# Patient Record
Sex: Female | Born: 1940 | Race: White | Hispanic: No | Marital: Married | Smoking: Never smoker
Health system: Southern US, Community
[De-identification: ages and names within clinical notes are randomized; demographics above are authoritative.]

## PROBLEM LIST (undated history)

## (undated) DIAGNOSIS — F32A Depression, unspecified: Secondary | ICD-10-CM

## (undated) DIAGNOSIS — F419 Anxiety disorder, unspecified: Secondary | ICD-10-CM

## (undated) DIAGNOSIS — F0390 Unspecified dementia without behavioral disturbance: Principal | ICD-10-CM

## (undated) DIAGNOSIS — N393 Stress incontinence (female) (male): Secondary | ICD-10-CM

## (undated) DIAGNOSIS — F329 Major depressive disorder, single episode, unspecified: Secondary | ICD-10-CM

## (undated) DIAGNOSIS — Z86011 Personal history of benign neoplasm of the brain: Secondary | ICD-10-CM

## (undated) HISTORY — DX: Unspecified dementia without behavioral disturbance: F03.90

## (undated) HISTORY — DX: Personal history of benign neoplasm of the brain: Z86.011

## (undated) HISTORY — DX: Stress incontinence (female) (male): N39.3

## (undated) HISTORY — DX: Major depressive disorder, single episode, unspecified: F32.9

## (undated) HISTORY — DX: Anxiety disorder, unspecified: F41.9

## (undated) HISTORY — DX: Depression, unspecified: F32.A

---
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
## 1973-08-11 HISTORY — PX: TUBAL LIGATION: SHX77

## 1998-02-28 ENCOUNTER — Other Ambulatory Visit: Admission: RE | Admit: 1998-02-28 | Discharge: 1998-02-28 | Payer: Self-pay | Admitting: *Deleted

## 1998-10-04 ENCOUNTER — Encounter: Payer: Self-pay | Admitting: *Deleted

## 1998-10-04 ENCOUNTER — Ambulatory Visit (HOSPITAL_COMMUNITY): Admission: RE | Admit: 1998-10-04 | Discharge: 1998-10-04 | Payer: Self-pay | Admitting: Obstetrics and Gynecology

## 1999-04-26 ENCOUNTER — Ambulatory Visit (HOSPITAL_COMMUNITY): Admission: RE | Admit: 1999-04-26 | Discharge: 1999-04-26 | Payer: Self-pay | Admitting: Gastroenterology

## 1999-05-08 ENCOUNTER — Other Ambulatory Visit: Admission: RE | Admit: 1999-05-08 | Discharge: 1999-05-08 | Payer: Self-pay | Admitting: *Deleted

## 1999-10-08 ENCOUNTER — Encounter: Payer: Self-pay | Admitting: *Deleted

## 1999-10-08 ENCOUNTER — Ambulatory Visit (HOSPITAL_COMMUNITY): Admission: RE | Admit: 1999-10-08 | Discharge: 1999-10-08 | Payer: Self-pay | Admitting: *Deleted

## 2000-05-08 ENCOUNTER — Other Ambulatory Visit: Admission: RE | Admit: 2000-05-08 | Discharge: 2000-05-08 | Payer: Self-pay | Admitting: *Deleted

## 2000-10-08 ENCOUNTER — Encounter: Payer: Self-pay | Admitting: *Deleted

## 2000-10-08 ENCOUNTER — Ambulatory Visit (HOSPITAL_COMMUNITY): Admission: RE | Admit: 2000-10-08 | Discharge: 2000-10-08 | Payer: Self-pay | Admitting: *Deleted

## 2001-05-12 ENCOUNTER — Other Ambulatory Visit: Admission: RE | Admit: 2001-05-12 | Discharge: 2001-05-12 | Payer: Self-pay | Admitting: *Deleted

## 2001-10-13 ENCOUNTER — Encounter: Payer: Self-pay | Admitting: *Deleted

## 2001-10-13 ENCOUNTER — Ambulatory Visit (HOSPITAL_COMMUNITY): Admission: RE | Admit: 2001-10-13 | Discharge: 2001-10-13 | Payer: Self-pay | Admitting: *Deleted

## 2002-05-16 ENCOUNTER — Other Ambulatory Visit: Admission: RE | Admit: 2002-05-16 | Discharge: 2002-05-16 | Payer: Self-pay | Admitting: *Deleted

## 2002-10-17 ENCOUNTER — Encounter: Payer: Self-pay | Admitting: *Deleted

## 2002-10-17 ENCOUNTER — Ambulatory Visit (HOSPITAL_COMMUNITY): Admission: RE | Admit: 2002-10-17 | Discharge: 2002-10-17 | Payer: Self-pay | Admitting: *Deleted

## 2003-10-18 ENCOUNTER — Ambulatory Visit (HOSPITAL_COMMUNITY): Admission: RE | Admit: 2003-10-18 | Discharge: 2003-10-18 | Payer: Self-pay | Admitting: *Deleted

## 2004-05-23 ENCOUNTER — Other Ambulatory Visit: Admission: RE | Admit: 2004-05-23 | Discharge: 2004-05-23 | Payer: Self-pay | Admitting: *Deleted

## 2004-06-20 ENCOUNTER — Ambulatory Visit (HOSPITAL_COMMUNITY): Admission: RE | Admit: 2004-06-20 | Discharge: 2004-06-20 | Payer: Self-pay | Admitting: Gastroenterology

## 2004-10-21 ENCOUNTER — Ambulatory Visit (HOSPITAL_COMMUNITY): Admission: RE | Admit: 2004-10-21 | Discharge: 2004-10-21 | Payer: Self-pay | Admitting: *Deleted

## 2005-05-26 ENCOUNTER — Other Ambulatory Visit: Admission: RE | Admit: 2005-05-26 | Discharge: 2005-05-26 | Payer: Self-pay | Admitting: Obstetrics and Gynecology

## 2005-06-03 ENCOUNTER — Encounter: Admission: RE | Admit: 2005-06-03 | Discharge: 2005-06-03 | Payer: Self-pay | Admitting: Obstetrics and Gynecology

## 2005-10-22 ENCOUNTER — Encounter: Admission: RE | Admit: 2005-10-22 | Discharge: 2005-10-22 | Payer: Self-pay | Admitting: Obstetrics and Gynecology

## 2006-11-11 ENCOUNTER — Ambulatory Visit (HOSPITAL_COMMUNITY): Admission: RE | Admit: 2006-11-11 | Discharge: 2006-11-11 | Payer: Self-pay | Admitting: Internal Medicine

## 2006-11-24 ENCOUNTER — Encounter: Admission: RE | Admit: 2006-11-24 | Discharge: 2006-11-24 | Payer: Self-pay | Admitting: Internal Medicine

## 2007-06-24 ENCOUNTER — Other Ambulatory Visit: Admission: RE | Admit: 2007-06-24 | Discharge: 2007-06-24 | Payer: Self-pay | Admitting: Obstetrics & Gynecology

## 2007-11-26 ENCOUNTER — Ambulatory Visit (HOSPITAL_COMMUNITY): Admission: RE | Admit: 2007-11-26 | Discharge: 2007-11-26 | Payer: Self-pay | Admitting: Internal Medicine

## 2008-07-17 ENCOUNTER — Encounter: Admission: RE | Admit: 2008-07-17 | Discharge: 2008-07-17 | Payer: Self-pay | Admitting: Internal Medicine

## 2008-11-27 ENCOUNTER — Ambulatory Visit (HOSPITAL_COMMUNITY): Admission: RE | Admit: 2008-11-27 | Discharge: 2008-11-27 | Payer: Self-pay | Admitting: Obstetrics & Gynecology

## 2009-09-18 ENCOUNTER — Encounter: Admission: RE | Admit: 2009-09-18 | Discharge: 2009-09-18 | Payer: Self-pay | Admitting: Internal Medicine

## 2009-11-18 ENCOUNTER — Observation Stay (HOSPITAL_COMMUNITY): Admission: EM | Admit: 2009-11-18 | Discharge: 2009-11-19 | Payer: Self-pay | Admitting: Emergency Medicine

## 2009-12-17 ENCOUNTER — Ambulatory Visit (HOSPITAL_COMMUNITY): Admission: RE | Admit: 2009-12-17 | Discharge: 2009-12-17 | Payer: Self-pay | Admitting: Internal Medicine

## 2009-12-31 ENCOUNTER — Ambulatory Visit: Payer: Self-pay | Admitting: Psychology

## 2010-03-02 ENCOUNTER — Emergency Department (HOSPITAL_COMMUNITY): Admission: EM | Admit: 2010-03-02 | Discharge: 2010-03-02 | Payer: Self-pay | Admitting: Emergency Medicine

## 2010-03-10 ENCOUNTER — Emergency Department (HOSPITAL_COMMUNITY): Admission: EM | Admit: 2010-03-10 | Discharge: 2010-03-10 | Payer: Self-pay | Admitting: Emergency Medicine

## 2010-09-01 ENCOUNTER — Encounter: Payer: Self-pay | Admitting: Internal Medicine

## 2010-10-26 LAB — URINALYSIS, ROUTINE W REFLEX MICROSCOPIC
Bilirubin Urine: NEGATIVE
Ketones, ur: NEGATIVE mg/dL
Nitrite: NEGATIVE
Nitrite: NEGATIVE
Protein, ur: NEGATIVE mg/dL
Urobilinogen, UA: 0.2 mg/dL (ref 0.0–1.0)
Urobilinogen, UA: 0.2 mg/dL (ref 0.0–1.0)

## 2010-10-26 LAB — COMPREHENSIVE METABOLIC PANEL
Albumin: 3.5 g/dL (ref 3.5–5.2)
BUN: 10 mg/dL (ref 6–23)
CO2: 26 mEq/L (ref 19–32)
Calcium: 8.9 mg/dL (ref 8.4–10.5)
Chloride: 104 mEq/L (ref 96–112)
Creatinine, Ser: 0.85 mg/dL (ref 0.4–1.2)
Total Bilirubin: 1.3 mg/dL — ABNORMAL HIGH (ref 0.3–1.2)
Total Protein: 6.3 g/dL (ref 6.0–8.3)

## 2010-10-26 LAB — CBC
HCT: 35.9 % — ABNORMAL LOW (ref 36.0–46.0)
HCT: 40.9 % (ref 36.0–46.0)
Hemoglobin: 14.3 g/dL (ref 12.0–15.0)
MCH: 33.5 pg (ref 26.0–34.0)
MCHC: 34.5 g/dL (ref 30.0–36.0)
MCHC: 34.9 g/dL (ref 30.0–36.0)
MCV: 96.1 fL (ref 78.0–100.0)
Platelets: 205 10*3/uL (ref 150–400)
Platelets: 284 10*3/uL (ref 150–400)
RDW: 13 % (ref 11.5–15.5)
RDW: 13.1 % (ref 11.5–15.5)
WBC: 5.6 10*3/uL (ref 4.0–10.5)

## 2010-10-26 LAB — HEPATIC FUNCTION PANEL
ALT: 26 U/L (ref 0–35)
AST: 23 U/L (ref 0–37)
Bilirubin, Direct: 0.2 mg/dL (ref 0.0–0.3)
Total Bilirubin: 0.9 mg/dL (ref 0.3–1.2)

## 2010-10-26 LAB — URINE CULTURE: Culture  Setup Time: 201107231803

## 2010-10-26 LAB — POCT I-STAT, CHEM 8
BUN: 12 mg/dL (ref 6–23)
Calcium, Ion: 1.07 mmol/L — ABNORMAL LOW (ref 1.12–1.32)
Creatinine, Ser: 0.7 mg/dL (ref 0.4–1.2)
TCO2: 24 mmol/L (ref 0–100)

## 2010-10-26 LAB — DIFFERENTIAL
Basophils Absolute: 0 10*3/uL (ref 0.0–0.1)
Basophils Absolute: 0 10*3/uL (ref 0.0–0.1)
Eosinophils Absolute: 0.1 10*3/uL (ref 0.0–0.7)
Lymphs Abs: 1.2 10*3/uL (ref 0.7–4.0)
Neutro Abs: 5.6 10*3/uL (ref 1.7–7.7)
Neutrophils Relative %: 74 % (ref 43–77)

## 2010-10-26 LAB — POCT CARDIAC MARKERS: CKMB, poc: <1 ng/mL — ABNORMAL LOW (ref 1.0–8.0)

## 2010-10-26 LAB — LIPASE, BLOOD: Lipase: 35 U/L (ref 11–59)

## 2010-10-30 LAB — CBC
HCT: 37.1 % (ref 36.0–46.0)
HCT: 40.2 % (ref 36.0–46.0)
MCV: 97.5 fL (ref 78.0–100.0)
Platelets: 166 10*3/uL (ref 150–400)
Platelets: 180 10*3/uL (ref 150–400)
RBC: 4.12 MIL/uL (ref 3.87–5.11)
RDW: 12.9 % (ref 11.5–15.5)
WBC: 5.1 10*3/uL (ref 4.0–10.5)
WBC: 5.7 10*3/uL (ref 4.0–10.5)

## 2010-10-30 LAB — COMPREHENSIVE METABOLIC PANEL
ALT: 14 U/L (ref 0–35)
AST: 20 U/L (ref 0–37)
AST: 21 U/L (ref 0–37)
Albumin: 3.5 g/dL (ref 3.5–5.2)
Albumin: 3.8 g/dL (ref 3.5–5.2)
Alkaline Phosphatase: 51 U/L (ref 39–117)
Alkaline Phosphatase: 55 U/L (ref 39–117)
BUN: 9 mg/dL (ref 6–23)
CO2: 18 mEq/L — ABNORMAL LOW (ref 19–32)
Chloride: 108 mEq/L (ref 96–112)
Chloride: 109 mEq/L (ref 96–112)
Creatinine, Ser: 0.8 mg/dL (ref 0.4–1.2)
GFR calc Af Amer: >60 mL/min (ref >60)
GFR calc Af Amer: >60 mL/min (ref >60)
GFR calc non Af Amer: >60 mL/min (ref >60)
Potassium: 3.5 mEq/L (ref 3.5–5.1)
Potassium: 3.7 mEq/L (ref 3.5–5.1)
Sodium: 139 mEq/L (ref 135–145)
Total Bilirubin: 1.6 mg/dL — ABNORMAL HIGH (ref 0.3–1.2)
Total Bilirubin: 2.2 mg/dL — ABNORMAL HIGH (ref 0.3–1.2)
Total Protein: 6 g/dL (ref 6.0–8.3)

## 2010-10-30 LAB — URINALYSIS, ROUTINE W REFLEX MICROSCOPIC
Glucose, UA: NEGATIVE mg/dL
Nitrite: NEGATIVE
pH: 7 (ref 5.0–8.0)

## 2010-10-30 LAB — POCT I-STAT, CHEM 8
BUN: 11 mg/dL (ref 6–23)
Chloride: 105 mEq/L (ref 96–112)
HCT: 42 % (ref 36.0–46.0)
Sodium: 135 mEq/L (ref 135–145)
TCO2: 18 mmol/L (ref 0–100)

## 2010-10-30 LAB — DIFFERENTIAL
Basophils Absolute: 0 10*3/uL (ref 0.0–0.1)
Basophils Relative: 1 % (ref 0–1)
Eosinophils Absolute: 0 10*3/uL (ref 0.0–0.7)
Eosinophils Relative: 1 % (ref 0–5)
Lymphocytes Relative: 20 % (ref 12–46)
Monocytes Absolute: 0.7 10*3/uL (ref 0.1–1.0)

## 2010-12-20 ENCOUNTER — Other Ambulatory Visit (HOSPITAL_COMMUNITY): Payer: Self-pay | Admitting: Internal Medicine

## 2010-12-20 DIAGNOSIS — Z1231 Encounter for screening mammogram for malignant neoplasm of breast: Secondary | ICD-10-CM

## 2010-12-27 ENCOUNTER — Ambulatory Visit (HOSPITAL_COMMUNITY)
Admission: RE | Admit: 2010-12-27 | Discharge: 2010-12-27 | Disposition: A | Discharge status: Home / Self Care | Payer: Medicare Other | Source: Ambulatory Visit | Attending: Internal Medicine | Admitting: Internal Medicine

## 2010-12-27 DIAGNOSIS — Z1231 Encounter for screening mammogram for malignant neoplasm of breast: Secondary | ICD-10-CM

## 2010-12-27 NOTE — Op Note (Signed)
NAMESELYNA, KLAHN                 ACCOUNT NO.:  [id number]   MEDICAL RECORD NO.:  [id number]          PATIENT TYPE:  AMB   LOCATION:  ENDO                         FACILITY:  [facility]   PHYSICIAN:  James L. Malon Kindle., M.D.DATE OF BIRTH:  [birth year]   DATE OF PROCEDURE:  06/20/2004  DATE OF DISCHARGE:                                 OPERATIVE REPORT   PROCEDURE:  Colonoscopy.   MEDICATIONS:  Fentanyl 75 mcg, Versed 10 mg IV.   SCOPE:  Olympus pediatric adjustable colonoscope.   INDICATIONS:  Strong family history of colon cancer done as a 5-year  followup procedure.   DESCRIPTION OF PROCEDURE:  Procedure had been explained to the patient and  consent obtained. With the patient in left lateral decubitus position,  Olympus scope was inserted and advanced. Prep was excellent. Using slight  abdominal pressure, we were able to reach the cecum without difficulty. The  ileocecal valve and appendiceal orifice were seen. The scope was withdrawn  to the cecum. Ascending colon, transverse colon, splenic flexure,  descending, and sigmoid colon were seen well upon removal. No polyps or  lesions were seen. The scope was withdrawn, and the patient tolerated the  procedure well and was maintained on low-flow oxygen and pulse oximetry  throughout.   ASSESSMENT:  Normal colonoscopy in a woman with a strong family history of  colon cancer, V16.0.   PLAN:  Will recommend yearly Hemoccults and repeat colonoscopy in 5 years.      JLE/MEDQ  D:  06/20/2004  T:  06/20/2004  Job:  [id number]

## 2011-09-17 DIAGNOSIS — C44519 Basal cell carcinoma of skin of other part of trunk: Secondary | ICD-10-CM | POA: Diagnosis not present

## 2011-09-17 DIAGNOSIS — Z85828 Personal history of other malignant neoplasm of skin: Secondary | ICD-10-CM | POA: Diagnosis not present

## 2011-09-17 DIAGNOSIS — D239 Other benign neoplasm of skin, unspecified: Secondary | ICD-10-CM | POA: Diagnosis not present

## 2011-09-17 DIAGNOSIS — L821 Other seborrheic keratosis: Secondary | ICD-10-CM | POA: Diagnosis not present

## 2011-09-23 DIAGNOSIS — D485 Neoplasm of uncertain behavior of skin: Secondary | ICD-10-CM | POA: Diagnosis not present

## 2011-09-23 DIAGNOSIS — L851 Acquired keratosis [keratoderma] palmaris et plantaris: Secondary | ICD-10-CM | POA: Diagnosis not present

## 2011-09-23 DIAGNOSIS — C44519 Basal cell carcinoma of skin of other part of trunk: Secondary | ICD-10-CM | POA: Diagnosis not present

## 2011-11-11 DIAGNOSIS — J343 Hypertrophy of nasal turbinates: Secondary | ICD-10-CM | POA: Diagnosis not present

## 2011-11-11 DIAGNOSIS — J342 Deviated nasal septum: Secondary | ICD-10-CM | POA: Diagnosis not present

## 2011-11-27 DIAGNOSIS — E785 Hyperlipidemia, unspecified: Secondary | ICD-10-CM | POA: Diagnosis not present

## 2011-11-27 DIAGNOSIS — M899 Disorder of bone, unspecified: Secondary | ICD-10-CM | POA: Diagnosis not present

## 2011-11-27 DIAGNOSIS — E042 Nontoxic multinodular goiter: Secondary | ICD-10-CM | POA: Diagnosis not present

## 2011-11-27 DIAGNOSIS — Z0000 Encounter for general adult medical examination without abnormal findings: Secondary | ICD-10-CM | POA: Diagnosis not present

## 2011-11-27 DIAGNOSIS — R82998 Other abnormal findings in urine: Secondary | ICD-10-CM | POA: Diagnosis not present

## 2011-12-02 DIAGNOSIS — Z01419 Encounter for gynecological examination (general) (routine) without abnormal findings: Secondary | ICD-10-CM | POA: Diagnosis not present

## 2011-12-02 DIAGNOSIS — Z124 Encounter for screening for malignant neoplasm of cervix: Secondary | ICD-10-CM | POA: Diagnosis not present

## 2011-12-04 ENCOUNTER — Other Ambulatory Visit: Payer: Self-pay | Admitting: Internal Medicine

## 2011-12-04 DIAGNOSIS — E042 Nontoxic multinodular goiter: Secondary | ICD-10-CM | POA: Diagnosis not present

## 2011-12-04 DIAGNOSIS — E785 Hyperlipidemia, unspecified: Secondary | ICD-10-CM | POA: Diagnosis not present

## 2011-12-04 DIAGNOSIS — E041 Nontoxic single thyroid nodule: Secondary | ICD-10-CM

## 2011-12-04 DIAGNOSIS — F341 Dysthymic disorder: Secondary | ICD-10-CM | POA: Diagnosis not present

## 2011-12-04 DIAGNOSIS — Z0000 Encounter for general adult medical examination without abnormal findings: Secondary | ICD-10-CM | POA: Diagnosis not present

## 2011-12-05 ENCOUNTER — Ambulatory Visit
Admission: RE | Admit: 2011-12-05 | Discharge: 2011-12-05 | Disposition: A | Discharge status: Home / Self Care | Payer: Medicare Other | Source: Ambulatory Visit | Attending: Internal Medicine | Admitting: Internal Medicine

## 2011-12-05 DIAGNOSIS — E042 Nontoxic multinodular goiter: Secondary | ICD-10-CM | POA: Diagnosis not present

## 2011-12-05 DIAGNOSIS — E041 Nontoxic single thyroid nodule: Secondary | ICD-10-CM

## 2011-12-09 DIAGNOSIS — Z1212 Encounter for screening for malignant neoplasm of rectum: Secondary | ICD-10-CM | POA: Diagnosis not present

## 2011-12-16 ENCOUNTER — Other Ambulatory Visit: Payer: Self-pay | Admitting: Obstetrics & Gynecology

## 2011-12-16 DIAGNOSIS — Z1231 Encounter for screening mammogram for malignant neoplasm of breast: Secondary | ICD-10-CM

## 2012-01-09 ENCOUNTER — Ambulatory Visit (HOSPITAL_COMMUNITY)
Admission: RE | Admit: 2012-01-09 | Discharge: 2012-01-09 | Disposition: A | Discharge status: Home / Self Care | Payer: Medicare Other | Source: Ambulatory Visit | Attending: Obstetrics & Gynecology | Admitting: Obstetrics & Gynecology

## 2012-01-09 DIAGNOSIS — Z1231 Encounter for screening mammogram for malignant neoplasm of breast: Secondary | ICD-10-CM | POA: Insufficient documentation

## 2012-01-28 DIAGNOSIS — L919 Hypertrophic disorder of the skin, unspecified: Secondary | ICD-10-CM | POA: Diagnosis not present

## 2012-01-28 DIAGNOSIS — L821 Other seborrheic keratosis: Secondary | ICD-10-CM | POA: Diagnosis not present

## 2012-01-28 DIAGNOSIS — L909 Atrophic disorder of skin, unspecified: Secondary | ICD-10-CM | POA: Diagnosis not present

## 2012-02-04 DIAGNOSIS — M87059 Idiopathic aseptic necrosis of unspecified femur: Secondary | ICD-10-CM | POA: Diagnosis not present

## 2012-03-29 DIAGNOSIS — Z1211 Encounter for screening for malignant neoplasm of colon: Secondary | ICD-10-CM | POA: Diagnosis not present

## 2012-03-29 DIAGNOSIS — Z800 Family history of malignant neoplasm of digestive organs: Secondary | ICD-10-CM | POA: Diagnosis not present

## 2012-03-29 DIAGNOSIS — K648 Other hemorrhoids: Secondary | ICD-10-CM | POA: Diagnosis not present

## 2012-04-16 DIAGNOSIS — H2510 Age-related nuclear cataract, unspecified eye: Secondary | ICD-10-CM | POA: Diagnosis not present

## 2012-04-16 DIAGNOSIS — H52209 Unspecified astigmatism, unspecified eye: Secondary | ICD-10-CM | POA: Diagnosis not present

## 2012-04-16 DIAGNOSIS — H5200 Hypermetropia, unspecified eye: Secondary | ICD-10-CM | POA: Diagnosis not present

## 2012-04-16 DIAGNOSIS — L0293 Carbuncle, unspecified: Secondary | ICD-10-CM | POA: Diagnosis not present

## 2012-04-16 DIAGNOSIS — L0292 Furuncle, unspecified: Secondary | ICD-10-CM | POA: Diagnosis not present

## 2012-05-04 DIAGNOSIS — Z23 Encounter for immunization: Secondary | ICD-10-CM | POA: Diagnosis not present

## 2012-07-16 DIAGNOSIS — F341 Dysthymic disorder: Secondary | ICD-10-CM | POA: Diagnosis not present

## 2012-07-16 DIAGNOSIS — M1990 Unspecified osteoarthritis, unspecified site: Secondary | ICD-10-CM | POA: Diagnosis not present

## 2012-07-16 DIAGNOSIS — E042 Nontoxic multinodular goiter: Secondary | ICD-10-CM | POA: Diagnosis not present

## 2012-07-16 DIAGNOSIS — R5381 Other malaise: Secondary | ICD-10-CM | POA: Diagnosis not present

## 2012-07-16 DIAGNOSIS — R413 Other amnesia: Secondary | ICD-10-CM | POA: Diagnosis not present

## 2012-07-16 DIAGNOSIS — R5383 Other fatigue: Secondary | ICD-10-CM | POA: Diagnosis not present

## 2012-09-01 DIAGNOSIS — F0390 Unspecified dementia without behavioral disturbance: Secondary | ICD-10-CM | POA: Diagnosis not present

## 2012-09-01 DIAGNOSIS — G501 Atypical facial pain: Secondary | ICD-10-CM | POA: Diagnosis not present

## 2012-09-01 DIAGNOSIS — F22 Delusional disorders: Secondary | ICD-10-CM | POA: Diagnosis not present

## 2012-10-14 DIAGNOSIS — L819 Disorder of pigmentation, unspecified: Secondary | ICD-10-CM | POA: Diagnosis not present

## 2012-10-14 DIAGNOSIS — D485 Neoplasm of uncertain behavior of skin: Secondary | ICD-10-CM | POA: Diagnosis not present

## 2012-10-14 DIAGNOSIS — L820 Inflamed seborrheic keratosis: Secondary | ICD-10-CM | POA: Diagnosis not present

## 2012-10-14 DIAGNOSIS — L723 Sebaceous cyst: Secondary | ICD-10-CM | POA: Diagnosis not present

## 2012-10-14 DIAGNOSIS — D1801 Hemangioma of skin and subcutaneous tissue: Secondary | ICD-10-CM | POA: Diagnosis not present

## 2012-10-14 DIAGNOSIS — L821 Other seborrheic keratosis: Secondary | ICD-10-CM | POA: Diagnosis not present

## 2012-10-14 DIAGNOSIS — D239 Other benign neoplasm of skin, unspecified: Secondary | ICD-10-CM | POA: Diagnosis not present

## 2012-12-02 ENCOUNTER — Ambulatory Visit (INDEPENDENT_AMBULATORY_CARE_PROVIDER_SITE_OTHER): Payer: Medicare Other | Admitting: Neurology

## 2012-12-02 ENCOUNTER — Encounter: Payer: Self-pay | Admitting: Neurology

## 2012-12-02 VITALS — BP 145/87 | HR 65 | Temp 97.6°F | Ht 64.0 in | Wt 165.5 lb

## 2012-12-02 DIAGNOSIS — F419 Anxiety disorder, unspecified: Secondary | ICD-10-CM

## 2012-12-02 DIAGNOSIS — F411 Generalized anxiety disorder: Secondary | ICD-10-CM | POA: Diagnosis not present

## 2012-12-02 DIAGNOSIS — Z86011 Personal history of benign neoplasm of the brain: Secondary | ICD-10-CM

## 2012-12-02 DIAGNOSIS — F32A Depression, unspecified: Secondary | ICD-10-CM

## 2012-12-02 DIAGNOSIS — F329 Major depressive disorder, single episode, unspecified: Secondary | ICD-10-CM | POA: Insufficient documentation

## 2012-12-02 DIAGNOSIS — F0391 Unspecified dementia with behavioral disturbance: Secondary | ICD-10-CM | POA: Insufficient documentation

## 2012-12-02 DIAGNOSIS — F0390 Unspecified dementia without behavioral disturbance: Secondary | ICD-10-CM | POA: Diagnosis not present

## 2012-12-02 HISTORY — DX: Anxiety disorder, unspecified: F41.9

## 2012-12-02 HISTORY — DX: Depression, unspecified: F32.A

## 2012-12-02 HISTORY — DX: Unspecified dementia, unspecified severity, without behavioral disturbance, psychotic disturbance, mood disturbance, and anxiety: F03.90

## 2012-12-02 HISTORY — DX: Personal history of benign neoplasm of the brain: Z86.011

## 2012-12-02 NOTE — Patient Instructions (Addendum)
I think overall you are doing fairly well but I do want to suggest a few things today:  I think you did better today with the memory test!  Remember to drink plenty of fluid, eat healthy meals and do not skip any meals. Try to eat protein with a every meal and eat a healthy snack such as fruit or nuts in between meals. Try to keep a regular sleep-wake schedule and try to exercise daily, particularly in the form of walking, 20-30 minutes a day, if you can.   Engage in social activities in your community and with your family and try to keep up with current events by reading the newspaper or watching the news.   As far as your medications are concerned, I would like to suggest the Exelon Patch. Pls call for refills  I would like to see you back in 3 months, sooner if we need to. Please call us with any interim questions, concerns, problems, updates or refill requests.  Please also call us for any test results so we can go over those with you on the phone. Brett Canales is my clinical assistant and will answer any of your questions and relay your messages to me and also relay most of my messages to you.  Our phone number is [phone]. We also have an after hours call service for urgent matters and there is a physician on-call for urgent questions. For any emergencies you know to call 911 or go to the nearest emergency room.

## 2012-12-02 NOTE — Progress Notes (Signed)
Subjective:    Patient ID: Gina Edwards is a 72 y.o. female.  HPI  Interim history:   Gina Edwards is a very pleasant 72 year old right-handed woman who presents for followup consultation of her memory loss, most likely dementia of the Alzheimer's type. The patient is accompanied by her husband today and today is her first visit with me. She was previously seen by Dr. Carrie Mew was last seen by him on 09/01/2012, at which time he started her on Exelon 4.6 mg patches. He also started her on gabapentin 100 mg 3 times a day for facial pain. Her falls assessment pulls score at the time of 7. She has a history of a meningioma and he suggested reevaluation with an MRI brain at some point. She is currently on Exelon 4.6 mg patch, gabapentin 100 mg 3 times a day as needed for nose/atypical facial pain, Abilify 10 mg daily, Lexapro 10 mg daily, Xanax 1 mg once daily, multivitamin. Her husband states that they never actually started the patch as she is already on other meds and he was concerned about SE. She again is not keen on getting a repeat neuropsych test done, stating: "I don't want to go through this". She has no particular complaint and they both feel her memory has remained about the same in the last 3 months. She has had not recent changes to her medicines and had no recent illness. She did have an episode of vertigo some weeks ago.   I reviewed Dr. Imagene Edwards prior notes and the patient's records and below is a summary of that review:  72 year old right-handed woman with a three-year history of memory loss. She has no history of alcohol or drug abuse or head trauma. 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 Head CT from November 2010 had shown small vessel disease and mild diffuse atrophy. She had blood work in March 2011 including TSH, RPR, B12, all within normal limits. She had neuropsych testing in  May 2011 showing intellectual functioning in the borderline range with full scale IQ of 77. She was placed on generic Aricept but developed side effects including weight loss and hallucinations and eventually the medication was discontinued in August 2011. She is followed by her psychiatrist and a repeat neuropsychological test was recommended by her psychiatrist but she declined. She exercises regularly and denies any diplopia or swallowing problems. She has had right facial pain not consistent with trigeminal neuralgia.  Her Past Medical History Is Significant For: Past Medical History  Diagnosis Date  . Anxiety and depression    Her Past Surgical History Is Significant For: No past surgical history on file.  Her Family History Is Significant For: Family History  Problem Relation Age of Onset  . Heart disease Mother   . Colon cancer Mother   . Prostate cancer Father    Her Social History Is Significant For: History   Social History  . Marital Status: Married    Spouse Name: N/A    Number of Children: N/A  . Years of Education: N/A   Social History Main Topics  . Smoking status: Never Smoker   . Smokeless tobacco: Not on file  . Alcohol Use: No  . Drug Use: No  . Sexually Active: Not on file   Other Topics Concern  . Not on file   Social History Narrative   Pt live at home with spouse.  Caffeine Use- 24oz   Her Allergies Are:  Allergies  Allergen Reactions  . Morphine And Related   :  Her Current Medications Are:  Outpatient Encounter Prescriptions as of 12/02/2012  Medication Sig Dispense Refill  . ABILIFY 2 MG tablet Take 1 tablet by mouth daily.      Marland Kitchen ALPRAZolam (XANAX) 1 MG tablet Take 1 tablet by mouth daily.      Marland Kitchen escitalopram (LEXAPRO) 10 MG tablet Take 2 tablets by mouth daily.      Marland Kitchen gabapentin (NEURONTIN) 100 MG capsule Take 1 capsule by mouth as needed.      Marland Kitchen ibuprofen (ADVIL,MOTRIN) 800 MG tablet Take 2 tablets by mouth daily. 400mg        No  facility-administered encounter medications on file as of 12/02/2012.  : Review of Systems  Respiratory:       Snoring  Gastrointestinal:       Incontinence  Neurological: Positive for weakness.       Memory loss  Psychiatric/Behavioral: Positive for dysphoric mood. The patient is nervous/anxious.     Objective:  Neurologic Exam  Physical Exam Physical Examination:   Filed Vitals:   12/02/12 1046  BP: 145/87  Pulse: 65  Temp: 97.6 F (36.4 C)    General Examination: The patient is a very pleasant 72 y.o. female in no acute distress. She is calm and cooperative with the exam. She denies Auditory Hallucinations and Visual Hallucinations.   HEENT: Normocephalic, atraumatic, pupils are equal, round and reactive to light and accommodation. Funduscopic exam is normal with sharp disc margins noted. Extraocular tracking shows mild saccadic breakdown without nystagmus noted. Hearing is intact. Tympanic membranes are clear bilaterally. Face is symmetric with no facial masking and normal facial sensation. There is no lip, neck or jaw tremor. Neck is mildly rigid with intact passive ROM. There are no carotid bruits on auscultation. Oropharynx exam reveals mild mouth dryness. No significant airway crowding is noted. Mallampati is class II. Tongue protrudes centrally and palate elevates symmetrically.    Chest: is clear to auscultation without wheezing, rhonchi or crackles noted.  Heart: sounds are regular and normal without murmurs, rubs or gallops noted.   Abdomen: is soft, non-tender and non-distended with normal bowel sounds appreciated on auscultation.  Extremities: There is no pitting edema in the distal lower extremities bilaterally.   Skin: is warm and dry with no trophic changes noted.  Musculoskeletal: exam reveals no obvious joint deformities, tenderness or joint swelling or erythema.  Neurologically:  Mental status: The patient is awake and alert, paying good  attention. She  is unable to to completely provide the history. Her husband provides details. She is oriented to: person, place, situation and month of yearHer memory, attention, language and knowledge are impaired. There is no aphasia, agnosia, apraxia or anomia. There is a mild degree of bradyphrenia. Speech is mildly hypophonic with no dysarthria noted. Mood is congruent and affect is normal.  Her MMSE score is 22/30. CDT is 3/4. AFT (Animal Fluency Test) score is 7.   Cranial nerves are as described above under HEENT exam. In addition, shoulder shrug is normal with equal shoulder height noted.  Motor exam: Normal bulk, and strength for age is noted. Tone is Not rigid with absence of cogwheeling in the extremities. There is overall no significant bradykinesia. There is no drift or rebound. There is no tremor. Romberg is negative. Reflexes are 1+ in the upper extremities and 1+ in the lower extremities. Toes are downgoing bilaterally.  Fine motor skills: Finger taps, hand movements, and rapid alternating patting are Not significantly impaired bilaterally. Foot taps and foot agility are mildly impaired bilaterally.   Cerebellar testing shows no dysmetria or intention tremor. There is no truncal or gait ataxia.   Sensory exam is intact to light touch.  Gait, station and balance: She stands up from the seated position with no difficulty. No veering to one side is noted. No leaning to one side. Posture is Age-appropriate. Stance is narrow-based. She turns in en bloc.Balance is Not significantly impaired.     Assessment and Plan:  Assessment and Plan:  In summary, SHANIGUA GIBB is a very pleasant 72 y.o.-year old female with a history of dementia. She also has a history of depression and anxiety. She had not done well in the past with generic Aricept and developed hallucinations. She was recently seen back by Dr. love and he had suggested a trial of Exelon patch but she never started this. Today her MMSE is a little  better than last time as well as her animal fluency. I didn't suggest the medication again to them and would like to see her back for reevaluation in 3 months. I talked to him about potential side effects with Exelon and the patient and her husband were agreeable to trying this. They did not need another prescriptio at this time since they do have the medicine from her last visit. She follows with Glean Hess, nurse practitioner with Dr. Loralie Champagne office for mood disorder. I've encouraged him to call with any interim questions , concerns, problems or updates and refill requests.

## 2012-12-03 DIAGNOSIS — E042 Nontoxic multinodular goiter: Secondary | ICD-10-CM | POA: Diagnosis not present

## 2012-12-03 DIAGNOSIS — R82998 Other abnormal findings in urine: Secondary | ICD-10-CM | POA: Diagnosis not present

## 2012-12-03 DIAGNOSIS — E785 Hyperlipidemia, unspecified: Secondary | ICD-10-CM | POA: Diagnosis not present

## 2012-12-03 DIAGNOSIS — M899 Disorder of bone, unspecified: Secondary | ICD-10-CM | POA: Diagnosis not present

## 2012-12-07 ENCOUNTER — Other Ambulatory Visit: Payer: Self-pay | Admitting: Obstetrics & Gynecology

## 2012-12-07 DIAGNOSIS — Z1231 Encounter for screening mammogram for malignant neoplasm of breast: Secondary | ICD-10-CM

## 2012-12-10 DIAGNOSIS — D320 Benign neoplasm of cerebral meninges: Secondary | ICD-10-CM | POA: Diagnosis not present

## 2012-12-10 DIAGNOSIS — J3489 Other specified disorders of nose and nasal sinuses: Secondary | ICD-10-CM | POA: Diagnosis not present

## 2012-12-10 DIAGNOSIS — Z0000 Encounter for general adult medical examination without abnormal findings: Secondary | ICD-10-CM | POA: Diagnosis not present

## 2012-12-10 DIAGNOSIS — M1990 Unspecified osteoarthritis, unspecified site: Secondary | ICD-10-CM | POA: Diagnosis not present

## 2012-12-10 DIAGNOSIS — J309 Allergic rhinitis, unspecified: Secondary | ICD-10-CM | POA: Diagnosis not present

## 2012-12-10 DIAGNOSIS — R413 Other amnesia: Secondary | ICD-10-CM | POA: Diagnosis not present

## 2012-12-10 DIAGNOSIS — E042 Nontoxic multinodular goiter: Secondary | ICD-10-CM | POA: Diagnosis not present

## 2012-12-10 DIAGNOSIS — K219 Gastro-esophageal reflux disease without esophagitis: Secondary | ICD-10-CM | POA: Diagnosis not present

## 2012-12-13 DIAGNOSIS — Z1212 Encounter for screening for malignant neoplasm of rectum: Secondary | ICD-10-CM | POA: Diagnosis not present

## 2012-12-22 ENCOUNTER — Telehealth: Payer: Self-pay | Admitting: Neurology

## 2012-12-22 MED ORDER — RIVASTIGMINE 4.6 MG/24HR TD PT24: 1.0000 | MEDICATED_PATCH | Freq: Every day | TRANSDERMAL | Status: DC

## 2012-12-23 ENCOUNTER — Encounter: Payer: Self-pay | Admitting: Obstetrics & Gynecology

## 2012-12-23 NOTE — Telephone Encounter (Signed)
Rx sent, samples dispensed

## 2012-12-24 ENCOUNTER — Ambulatory Visit (INDEPENDENT_AMBULATORY_CARE_PROVIDER_SITE_OTHER): Payer: Medicare Other | Admitting: Obstetrics & Gynecology

## 2012-12-24 ENCOUNTER — Encounter: Payer: Self-pay | Admitting: Obstetrics & Gynecology

## 2012-12-24 VITALS — BP 140/76 | HR 64 | Ht 64.25 in | Wt 165.0 lb

## 2012-12-24 DIAGNOSIS — Z01419 Encounter for gynecological examination (general) (routine) without abnormal findings: Secondary | ICD-10-CM | POA: Diagnosis not present

## 2012-12-24 NOTE — Patient Instructions (Signed)

## 2012-12-24 NOTE — Progress Notes (Signed)
Patient ID: Gina Edwards, female   DOB: 1941/05/09, 72 y.o.   MRN: [id number]  72 y.o. G1P1001 MarriedCaucasianF here for annual exam.  No vaginal bleeding.  Doing okay.  Last visit with Dr. Timothy Lasso was about six months ago.  Blood work was all normal.  Definitely more memory issues this year.  On Alzheimer's medication this year.  No LMP recorded. Patient is postmenopausal.          Sexually active: yes  The current method of family planning is post menopausal status.    Exercising: yes  walking Smoker:  no  Health Maintenance: Pap:  09/25/09 History of abnormal Pap:  no  MMG:  01/2012 Colonoscopy:  03/2012 BMD:   2010, Dr. Timothy Lasso follow TDaP:  11/2011 (with Dr. Timothy Lasso) Screening Labs: PCP, Hb today: PCP, Urine today: PCP   reports that she has never smoked. She has never used smokeless tobacco. She reports that she does not drink alcohol or use illicit drugs.  Past Medical History  Diagnosis Date  . Anxiety and depression   . Dementia 12/02/2012  . Depression 12/02/2012  . Anxiety 12/02/2012  . H/O meningioma of the brain 12/02/2012  . SUI (stress urinary incontinence, female)     Past Surgical History  Procedure Laterality Date  . Appendectomy  1957  . Tubal ligation Bilateral 1975    Current Outpatient Prescriptions  Medication Sig Dispense Refill  . ABILIFY 2 MG tablet Take 1 tablet by mouth daily.      Marland Kitchen ALPRAZolam (XANAX) 1 MG tablet Take 1 tablet by mouth daily.      Marland Kitchen escitalopram (LEXAPRO) 10 MG tablet Take 2 tablets by mouth daily.      Marland Kitchen gabapentin (NEURONTIN) 100 MG capsule Take 1 capsule by mouth as needed.      Marland Kitchen ibuprofen (ADVIL,MOTRIN) 800 MG tablet Take 2 tablets by mouth daily. 400mg       . rivastigmine (EXELON) 4.6 mg/24hr Place 1 patch (4.6 mg total) onto the skin daily.  90 patch  1   No current facility-administered medications for this visit.    Family History  Problem Relation Age of Onset  . Heart disease Mother   . Colon cancer Mother 39  . Prostate  cancer Father     ROS:  Pertinent items are noted in HPI.  Otherwise, a comprehensive ROS was negative.  Exam:   BP 140/76  Pulse 64  Ht 5' 4.25" (1.632 m)  Wt 165 lb (74.844 kg)  BMI 28.1 kg/m2  Weight change: +3lbs   Height: 5' 4.25" (163.2 cm)  Ht Readings from Last 3 Encounters:  12/24/12 5' 4.25" (1.632 m)  12/02/12 5\' 4"  (1.626 m)    General appearance: alert, cooperative and appears stated age Head: Normocephalic, without obvious abnormality, atraumatic Neck: no adenopathy, supple, symmetrical, trachea midline and thyroid normal to inspection and palpation Lungs: clear to auscultation bilaterally Breasts: normal appearance, no masses or tenderness Heart: regular rate and rhythm Abdomen: soft, non-tender; bowel sounds normal; no masses,  no organomegaly Extremities: extremities normal, atraumatic, no cyanosis or edema Skin: Skin color, texture, turgor normal. No rashes or lesions Lymph nodes: Cervical, supraclavicular, and axillary nodes normal. No abnormal inguinal nodes palpated Neurologic: Grossly normal   Pelvic: External genitalia:  no lesions              Urethra:  normal appearing urethra with no masses, tenderness or lesions              Bartholins  and Skenes: normal                 Vagina: normal appearing vagina with normal color and discharge, no lesions              Cervix: no lesions              Pap taken: no Bimanual Exam:  Uterus:  normal size, contour, position, consistency, mobility, non-tender              Adnexa: no mass, fullness, tenderness               Rectovaginal: Confirms               Anus:  normal sphincter tone, no lesions  A:  Well Woman with normal exam PMP, No HRT H/O depression, on multiple medications Memory issues  P:   Mammogram yearly pap smear not indicated, PMP female, no hx of abnormal Pap smears Labs with Dr. Timothy Lasso, who also follows her BMD return annually or prn  An After Visit Summary was printed and given to the  patient.

## 2013-01-10 ENCOUNTER — Ambulatory Visit (HOSPITAL_COMMUNITY)
Admission: RE | Admit: 2013-01-10 | Discharge: 2013-01-10 | Disposition: A | Discharge status: Home / Self Care | Payer: Medicare Other | Source: Ambulatory Visit | Attending: Obstetrics & Gynecology | Admitting: Obstetrics & Gynecology

## 2013-01-10 DIAGNOSIS — Z1231 Encounter for screening mammogram for malignant neoplasm of breast: Secondary | ICD-10-CM

## 2013-02-03 DIAGNOSIS — M26629 Arthralgia of temporomandibular joint, unspecified side: Secondary | ICD-10-CM | POA: Diagnosis not present

## 2013-02-03 DIAGNOSIS — G501 Atypical facial pain: Secondary | ICD-10-CM | POA: Diagnosis not present

## 2013-02-03 DIAGNOSIS — M26609 Unspecified temporomandibular joint disorder, unspecified side: Secondary | ICD-10-CM | POA: Diagnosis not present

## 2013-04-26 DIAGNOSIS — H2510 Age-related nuclear cataract, unspecified eye: Secondary | ICD-10-CM | POA: Diagnosis not present

## 2013-04-26 DIAGNOSIS — H5200 Hypermetropia, unspecified eye: Secondary | ICD-10-CM | POA: Diagnosis not present

## 2013-05-12 DIAGNOSIS — Z6827 Body mass index (BMI) 27.0-27.9, adult: Secondary | ICD-10-CM | POA: Diagnosis not present

## 2013-05-12 DIAGNOSIS — J029 Acute pharyngitis, unspecified: Secondary | ICD-10-CM | POA: Diagnosis not present

## 2013-05-12 DIAGNOSIS — J309 Allergic rhinitis, unspecified: Secondary | ICD-10-CM | POA: Diagnosis not present

## 2013-06-06 DIAGNOSIS — F341 Dysthymic disorder: Secondary | ICD-10-CM | POA: Diagnosis not present

## 2013-06-06 DIAGNOSIS — E785 Hyperlipidemia, unspecified: Secondary | ICD-10-CM | POA: Diagnosis not present

## 2013-06-06 DIAGNOSIS — R413 Other amnesia: Secondary | ICD-10-CM | POA: Diagnosis not present

## 2013-06-06 DIAGNOSIS — Z6827 Body mass index (BMI) 27.0-27.9, adult: Secondary | ICD-10-CM | POA: Diagnosis not present

## 2013-06-06 DIAGNOSIS — Z23 Encounter for immunization: Secondary | ICD-10-CM | POA: Diagnosis not present

## 2013-06-06 DIAGNOSIS — M899 Disorder of bone, unspecified: Secondary | ICD-10-CM | POA: Diagnosis not present

## 2013-06-16 DIAGNOSIS — H2510 Age-related nuclear cataract, unspecified eye: Secondary | ICD-10-CM | POA: Diagnosis not present

## 2013-06-16 DIAGNOSIS — H2589 Other age-related cataract: Secondary | ICD-10-CM | POA: Diagnosis not present

## 2013-08-16 DIAGNOSIS — M899 Disorder of bone, unspecified: Secondary | ICD-10-CM | POA: Diagnosis not present

## 2013-08-16 DIAGNOSIS — M949 Disorder of cartilage, unspecified: Secondary | ICD-10-CM | POA: Diagnosis not present

## 2013-10-13 DIAGNOSIS — H2589 Other age-related cataract: Secondary | ICD-10-CM | POA: Diagnosis not present

## 2013-10-13 DIAGNOSIS — H2510 Age-related nuclear cataract, unspecified eye: Secondary | ICD-10-CM | POA: Diagnosis not present

## 2013-10-20 DIAGNOSIS — L570 Actinic keratosis: Secondary | ICD-10-CM | POA: Diagnosis not present

## 2013-10-20 DIAGNOSIS — Z85828 Personal history of other malignant neoplasm of skin: Secondary | ICD-10-CM | POA: Diagnosis not present

## 2013-10-20 DIAGNOSIS — L723 Sebaceous cyst: Secondary | ICD-10-CM | POA: Diagnosis not present

## 2013-10-20 DIAGNOSIS — L819 Disorder of pigmentation, unspecified: Secondary | ICD-10-CM | POA: Diagnosis not present

## 2013-10-20 DIAGNOSIS — L820 Inflamed seborrheic keratosis: Secondary | ICD-10-CM | POA: Diagnosis not present

## 2013-10-20 DIAGNOSIS — L821 Other seborrheic keratosis: Secondary | ICD-10-CM | POA: Diagnosis not present

## 2013-10-20 DIAGNOSIS — D1801 Hemangioma of skin and subcutaneous tissue: Secondary | ICD-10-CM | POA: Diagnosis not present

## 2013-10-20 DIAGNOSIS — D239 Other benign neoplasm of skin, unspecified: Secondary | ICD-10-CM | POA: Diagnosis not present

## 2013-12-06 DIAGNOSIS — E042 Nontoxic multinodular goiter: Secondary | ICD-10-CM | POA: Diagnosis not present

## 2013-12-06 DIAGNOSIS — R82998 Other abnormal findings in urine: Secondary | ICD-10-CM | POA: Diagnosis not present

## 2013-12-06 DIAGNOSIS — M949 Disorder of cartilage, unspecified: Secondary | ICD-10-CM | POA: Diagnosis not present

## 2013-12-06 DIAGNOSIS — E785 Hyperlipidemia, unspecified: Secondary | ICD-10-CM | POA: Diagnosis not present

## 2013-12-06 DIAGNOSIS — M899 Disorder of bone, unspecified: Secondary | ICD-10-CM | POA: Diagnosis not present

## 2013-12-06 DIAGNOSIS — E041 Nontoxic single thyroid nodule: Secondary | ICD-10-CM | POA: Diagnosis not present

## 2013-12-13 DIAGNOSIS — Z1331 Encounter for screening for depression: Secondary | ICD-10-CM | POA: Diagnosis not present

## 2013-12-13 DIAGNOSIS — R5381 Other malaise: Secondary | ICD-10-CM | POA: Diagnosis not present

## 2013-12-13 DIAGNOSIS — R5383 Other fatigue: Secondary | ICD-10-CM | POA: Diagnosis not present

## 2013-12-13 DIAGNOSIS — Z0000 Encounter for general adult medical examination without abnormal findings: Secondary | ICD-10-CM | POA: Diagnosis not present

## 2013-12-13 DIAGNOSIS — L723 Sebaceous cyst: Secondary | ICD-10-CM | POA: Diagnosis not present

## 2013-12-13 DIAGNOSIS — D320 Benign neoplasm of cerebral meninges: Secondary | ICD-10-CM | POA: Diagnosis not present

## 2013-12-13 DIAGNOSIS — R413 Other amnesia: Secondary | ICD-10-CM | POA: Diagnosis not present

## 2013-12-13 DIAGNOSIS — M899 Disorder of bone, unspecified: Secondary | ICD-10-CM | POA: Diagnosis not present

## 2013-12-13 DIAGNOSIS — E785 Hyperlipidemia, unspecified: Secondary | ICD-10-CM | POA: Diagnosis not present

## 2013-12-13 DIAGNOSIS — M949 Disorder of cartilage, unspecified: Secondary | ICD-10-CM | POA: Diagnosis not present

## 2013-12-13 DIAGNOSIS — Z23 Encounter for immunization: Secondary | ICD-10-CM | POA: Diagnosis not present

## 2013-12-13 DIAGNOSIS — E042 Nontoxic multinodular goiter: Secondary | ICD-10-CM | POA: Diagnosis not present

## 2013-12-20 DIAGNOSIS — Z1212 Encounter for screening for malignant neoplasm of rectum: Secondary | ICD-10-CM | POA: Diagnosis not present

## 2014-05-15 DIAGNOSIS — M9903 Segmental and somatic dysfunction of lumbar region: Secondary | ICD-10-CM | POA: Diagnosis not present

## 2014-05-15 DIAGNOSIS — M62830 Muscle spasm of back: Secondary | ICD-10-CM | POA: Diagnosis not present

## 2014-05-15 DIAGNOSIS — M545 Low back pain: Secondary | ICD-10-CM | POA: Diagnosis not present

## 2014-05-16 DIAGNOSIS — M545 Low back pain: Secondary | ICD-10-CM | POA: Diagnosis not present

## 2014-05-16 DIAGNOSIS — Z23 Encounter for immunization: Secondary | ICD-10-CM | POA: Diagnosis not present

## 2014-06-12 ENCOUNTER — Encounter: Payer: Self-pay | Admitting: Obstetrics & Gynecology

## 2014-06-13 DIAGNOSIS — E785 Hyperlipidemia, unspecified: Secondary | ICD-10-CM | POA: Diagnosis not present

## 2014-06-13 DIAGNOSIS — F329 Major depressive disorder, single episode, unspecified: Secondary | ICD-10-CM | POA: Diagnosis not present

## 2014-06-13 DIAGNOSIS — M545 Low back pain: Secondary | ICD-10-CM | POA: Diagnosis not present

## 2014-06-13 DIAGNOSIS — Z6827 Body mass index (BMI) 27.0-27.9, adult: Secondary | ICD-10-CM | POA: Diagnosis not present

## 2014-06-13 DIAGNOSIS — M1990 Unspecified osteoarthritis, unspecified site: Secondary | ICD-10-CM | POA: Diagnosis not present

## 2014-06-13 DIAGNOSIS — R413 Other amnesia: Secondary | ICD-10-CM | POA: Diagnosis not present

## 2014-11-13 DIAGNOSIS — H524 Presbyopia: Secondary | ICD-10-CM | POA: Diagnosis not present

## 2014-11-13 DIAGNOSIS — Z961 Presence of intraocular lens: Secondary | ICD-10-CM | POA: Diagnosis not present

## 2014-11-14 DIAGNOSIS — R590 Localized enlarged lymph nodes: Secondary | ICD-10-CM | POA: Diagnosis not present

## 2014-11-14 DIAGNOSIS — Z6828 Body mass index (BMI) 28.0-28.9, adult: Secondary | ICD-10-CM | POA: Diagnosis not present

## 2014-12-12 DIAGNOSIS — M859 Disorder of bone density and structure, unspecified: Secondary | ICD-10-CM | POA: Diagnosis not present

## 2014-12-12 DIAGNOSIS — E041 Nontoxic single thyroid nodule: Secondary | ICD-10-CM | POA: Diagnosis not present

## 2014-12-12 DIAGNOSIS — Z008 Encounter for other general examination: Secondary | ICD-10-CM | POA: Diagnosis not present

## 2014-12-12 DIAGNOSIS — E785 Hyperlipidemia, unspecified: Secondary | ICD-10-CM | POA: Diagnosis not present

## 2014-12-14 DIAGNOSIS — Z85828 Personal history of other malignant neoplasm of skin: Secondary | ICD-10-CM | POA: Diagnosis not present

## 2014-12-14 DIAGNOSIS — D225 Melanocytic nevi of trunk: Secondary | ICD-10-CM | POA: Diagnosis not present

## 2014-12-14 DIAGNOSIS — L814 Other melanin hyperpigmentation: Secondary | ICD-10-CM | POA: Diagnosis not present

## 2014-12-14 DIAGNOSIS — D485 Neoplasm of uncertain behavior of skin: Secondary | ICD-10-CM | POA: Diagnosis not present

## 2014-12-14 DIAGNOSIS — L570 Actinic keratosis: Secondary | ICD-10-CM | POA: Diagnosis not present

## 2014-12-14 DIAGNOSIS — D2371 Other benign neoplasm of skin of right lower limb, including hip: Secondary | ICD-10-CM | POA: Diagnosis not present

## 2014-12-14 DIAGNOSIS — D1801 Hemangioma of skin and subcutaneous tissue: Secondary | ICD-10-CM | POA: Diagnosis not present

## 2014-12-14 DIAGNOSIS — L821 Other seborrheic keratosis: Secondary | ICD-10-CM | POA: Diagnosis not present

## 2014-12-14 DIAGNOSIS — C44719 Basal cell carcinoma of skin of left lower limb, including hip: Secondary | ICD-10-CM | POA: Diagnosis not present

## 2014-12-19 ENCOUNTER — Other Ambulatory Visit: Payer: Self-pay | Admitting: Internal Medicine

## 2014-12-19 DIAGNOSIS — Z1231 Encounter for screening mammogram for malignant neoplasm of breast: Secondary | ICD-10-CM

## 2014-12-19 DIAGNOSIS — M545 Low back pain: Secondary | ICD-10-CM | POA: Diagnosis not present

## 2014-12-19 DIAGNOSIS — Z6827 Body mass index (BMI) 27.0-27.9, adult: Secondary | ICD-10-CM | POA: Diagnosis not present

## 2014-12-19 DIAGNOSIS — F0390 Unspecified dementia without behavioral disturbance: Secondary | ICD-10-CM | POA: Diagnosis not present

## 2014-12-19 DIAGNOSIS — F329 Major depressive disorder, single episode, unspecified: Secondary | ICD-10-CM | POA: Diagnosis not present

## 2014-12-19 DIAGNOSIS — M1990 Unspecified osteoarthritis, unspecified site: Secondary | ICD-10-CM | POA: Diagnosis not present

## 2014-12-19 DIAGNOSIS — Z1389 Encounter for screening for other disorder: Secondary | ICD-10-CM | POA: Diagnosis not present

## 2014-12-19 DIAGNOSIS — Z0000 Encounter for general adult medical examination without abnormal findings: Secondary | ICD-10-CM | POA: Diagnosis not present

## 2014-12-19 DIAGNOSIS — E785 Hyperlipidemia, unspecified: Secondary | ICD-10-CM | POA: Diagnosis not present

## 2014-12-19 DIAGNOSIS — M8580 Other specified disorders of bone density and structure, unspecified site: Secondary | ICD-10-CM | POA: Diagnosis not present

## 2015-01-03 DIAGNOSIS — Z1212 Encounter for screening for malignant neoplasm of rectum: Secondary | ICD-10-CM | POA: Diagnosis not present

## 2015-01-04 ENCOUNTER — Ambulatory Visit: Payer: Medicare Other

## 2015-01-17 ENCOUNTER — Ambulatory Visit
Admission: RE | Admit: 2015-01-17 | Discharge: 2015-01-17 | Disposition: A | Discharge status: Home / Self Care | Payer: Medicare Other | Source: Ambulatory Visit | Attending: Internal Medicine | Admitting: Internal Medicine

## 2015-01-17 DIAGNOSIS — Z1231 Encounter for screening mammogram for malignant neoplasm of breast: Secondary | ICD-10-CM

## 2015-03-01 DIAGNOSIS — Z6827 Body mass index (BMI) 27.0-27.9, adult: Secondary | ICD-10-CM | POA: Diagnosis not present

## 2015-03-01 DIAGNOSIS — R590 Localized enlarged lymph nodes: Secondary | ICD-10-CM | POA: Diagnosis not present

## 2015-03-01 DIAGNOSIS — K148 Other diseases of tongue: Secondary | ICD-10-CM | POA: Diagnosis not present

## 2015-03-01 DIAGNOSIS — J309 Allergic rhinitis, unspecified: Secondary | ICD-10-CM | POA: Diagnosis not present

## 2015-04-12 DIAGNOSIS — L821 Other seborrheic keratosis: Secondary | ICD-10-CM | POA: Diagnosis not present

## 2015-04-12 DIAGNOSIS — Z85828 Personal history of other malignant neoplasm of skin: Secondary | ICD-10-CM | POA: Diagnosis not present

## 2015-06-18 DIAGNOSIS — M8580 Other specified disorders of bone density and structure, unspecified site: Secondary | ICD-10-CM | POA: Diagnosis not present

## 2015-06-18 DIAGNOSIS — E785 Hyperlipidemia, unspecified: Secondary | ICD-10-CM | POA: Diagnosis not present

## 2015-06-18 DIAGNOSIS — F329 Major depressive disorder, single episode, unspecified: Secondary | ICD-10-CM | POA: Diagnosis not present

## 2015-06-18 DIAGNOSIS — F0390 Unspecified dementia without behavioral disturbance: Secondary | ICD-10-CM | POA: Diagnosis not present

## 2015-06-18 DIAGNOSIS — Z23 Encounter for immunization: Secondary | ICD-10-CM | POA: Diagnosis not present

## 2015-06-18 DIAGNOSIS — Z6827 Body mass index (BMI) 27.0-27.9, adult: Secondary | ICD-10-CM | POA: Diagnosis not present

## 2015-10-13 ENCOUNTER — Ambulatory Visit (INDEPENDENT_AMBULATORY_CARE_PROVIDER_SITE_OTHER): Payer: Medicare Other | Admitting: Urgent Care

## 2015-10-13 VITALS — BP 124/72 | HR 80 | Temp 98.0°F | Resp 16 | Ht 64.25 in | Wt 163.0 lb

## 2015-10-13 DIAGNOSIS — Z87440 Personal history of urinary (tract) infections: Secondary | ICD-10-CM

## 2015-10-13 DIAGNOSIS — K130 Diseases of lips: Secondary | ICD-10-CM | POA: Diagnosis not present

## 2015-10-13 DIAGNOSIS — T887XXA Unspecified adverse effect of drug or medicament, initial encounter: Secondary | ICD-10-CM | POA: Diagnosis not present

## 2015-10-13 DIAGNOSIS — R350 Frequency of micturition: Secondary | ICD-10-CM | POA: Diagnosis not present

## 2015-10-13 DIAGNOSIS — R220 Localized swelling, mass and lump, head: Secondary | ICD-10-CM

## 2015-10-13 DIAGNOSIS — J029 Acute pharyngitis, unspecified: Secondary | ICD-10-CM

## 2015-10-13 DIAGNOSIS — T50905A Adverse effect of unspecified drugs, medicaments and biological substances, initial encounter: Secondary | ICD-10-CM

## 2015-10-13 LAB — POCT URINALYSIS DIP (MANUAL ENTRY)
BILIRUBIN UA: NEGATIVE
BILIRUBIN UA: NEGATIVE
Glucose, UA: NEGATIVE
Leukocytes, UA: NEGATIVE
Nitrite, UA: NEGATIVE
PROTEIN UA: NEGATIVE
Urobilinogen, UA: 0.2
pH, UA: 5

## 2015-10-13 LAB — POCT CBC
GRANULOCYTE PERCENT: 62.9 % (ref 37–80)
HEMATOCRIT: 42.1 % (ref 37.7–47.9)
Hemoglobin: 15 g/dL (ref 12.2–16.2)
LYMPH, POC: 1.9 (ref 0.6–3.4)
MCH, POC: 32.3 pg — AB (ref 27–31.2)
MCHC: 35.6 g/dL — AB (ref 31.8–35.4)
MCV: 90.7 fL (ref 80–97)
MID (CBC): 0.6 (ref 0–0.9)
MPV: 8.2 fL (ref 0–99.8)
POC GRANULOCYTE: 4.3 (ref 2–6.9)
POC LYMPH %: 27.7 % (ref 10–50)
POC MID %: 9.4 % (ref 0–12)
Platelet Count, POC: 195 10*3/uL (ref 142–424)
RBC: 4.64 M/uL (ref 4.04–5.48)
RDW, POC: 13.2 %
WBC: 6.9 10*3/uL (ref 4.6–10.2)

## 2015-10-13 LAB — POC MICROSCOPIC URINALYSIS (UMFC)

## 2015-10-13 MED ORDER — DIPHENHYDRAMINE HCL 50 MG/ML IJ SOLN
25.0000 mg | Freq: Once | INTRAMUSCULAR | Status: AC
  Administered 2015-10-13: 25 mg via INTRAMUSCULAR

## 2015-10-13 NOTE — Progress Notes (Signed)
MRN: [id number] DOB: [birth year]  Subjective:   Gina Edwards is a 75 y.o. female presenting for chief complaint of Neck Pain and swollen lips  Reports 4 day history of sore throat, started having lip swelling today, also raw and swollen tongue. Started taking ciprofloxacin for dysuria, Dr. Virgina Jock prescribed the medication to cover for UTI. She has since felt improvement in her dysuria but still has urinary frequency. Denies chest pain, shob, wheezing, sensation that her throat is closing, n/v, abdominal pain. She does have a chronic cough which has not changed in nature. Her husband states that he gave her benadryl last night but no other new exposures. No new foods or changes to her medications which she has taken consistently for the past 4 years. Denies smoking cigarettes or drinking alcohol.   Gina Edwards has a current medication list which includes the following prescription(s): abilify, alprazolam, ciprofloxacin, escitalopram, and esomeprazole. Also is allergic to morphine and related.  Gina Edwards  has a past medical history of Anxiety and depression; Dementia (12/02/2012); Depression (12/02/2012); Anxiety (12/02/2012); H/O meningioma of the brain (12/02/2012); and SUI (stress urinary incontinence, female). Also  has past surgical history that includes Appendectomy [id number]) and Tubal ligation (Bilateral, 1975).  Her family history includes Colon cancer (age of onset: 10) in her mother; Heart disease in her mother; Prostate cancer in her father.  Objective:   Vitals: BP 124/72 mmHg  Pulse 80  Temp(Src) 98 F (36.7 C) (Oral)  Resp 16  Ht 5' 4.25" (1.632 m)  Wt 163 lb (73.936 kg)  BMI 27.76 kg/m2  SpO2 93%  Physical Exam  Constitutional: She is oriented to person, place, and time. She appears well-developed and well-nourished.  HENT:  TM's intact bilaterally, no effusions or erythema. Nasal turbinates pink and moist, nasal passages patent. No sinus tenderness. Upper lip and tongue with trace  edema, mucous membranes moist, dentition in good repair.  Eyes: Right eye exhibits no discharge. Left eye exhibits no discharge. No scleral icterus.  Neck: Normal range of motion. Neck supple.  Cardiovascular: Normal rate, regular rhythm and intact distal pulses.  Exam reveals no gallop and no friction rub.   No murmur heard. Pulmonary/Chest: No respiratory distress. She has no wheezes. She has no rales.  Musculoskeletal: She exhibits no edema.  Lymphadenopathy:    She has no cervical adenopathy.  Neurological: She is alert and oriented to person, place, and time.  Skin: Skin is warm and dry.   Results for orders placed or performed in visit on 10/13/15 (from the past 24 hour(s))  POCT urinalysis dipstick     Status: Abnormal   Collection Time: 10/13/15  2:33 PM  Result Value Ref Range   Color, UA yellow yellow   Clarity, UA clear clear   Glucose, UA negative negative   Bilirubin, UA negative negative   Ketones, POC UA negative negative   Spec Grav, UA >=1.030    Blood, UA trace-intact (A) negative   pH, UA 5.0    Protein Ur, POC negative negative   Urobilinogen, UA 0.2    Nitrite, UA Negative Negative   Leukocytes, UA Negative Negative  POCT Microscopic Urinalysis ([facility])     Status: Abnormal   Collection Time: 10/13/15  2:33 PM  Result Value Ref Range   WBC,UR,HPF,POC Few (A) None WBC/hpf   RBC,UR,HPF,POC Few (A) None RBC/hpf   Bacteria Few (A) None, Too numerous to count   Mucus Present (A) Absent   Epithelial Cells, UR Per Microscopy Few (A)  None, Too numerous to count cells/hpf  POCT CBC     Status: Abnormal   Collection Time: 10/13/15  2:33 PM  Result Value Ref Range   WBC 6.9 4.6 - 10.2 K/uL   Lymph, poc 1.9 0.6 - 3.4   POC LYMPH PERCENT 27.7 10 - 50 %L   MID (cbc) 0.6 0 - 0.9   POC MID % 9.4 0 - 12 %M   POC Granulocyte 4.3 2 - 6.9   Granulocyte percent 62.9 37 - 80 %G   RBC 4.64 4.04 - 5.48 M/uL   Hemoglobin 15.0 12.2 - 16.2 g/dL   HCT, POC 42.1 37.7 - 47.9 %    MCV 90.7 80 - 97 fL   MCH, POC 32.3 (A) 27 - 31.2 pg   MCHC 35.6 (A) 31.8 - 35.4 g/dL   RDW, POC 13.2 %   Platelet Count, POC 195 142 - 424 K/uL   MPV 8.2 0 - 99.8 fL   Assessment and Plan :   1. Drug reaction, initial encounter 2. Lip swelling 3. Sore throat - Likely having a reaction to ciprofloxacin. She is to stop using this. Start Benadryl. Switch to Zyrtec and Zantac once symptoms settle down. Patient and her husband will call or rtc tomorrow to let me know how she is doing. Consider steroid injection/course if necessary.  4. Urinary frequency 5. History of recurrent UTIs - Advised aggressive hydration. Recheck with Dr. Virgina Jock if urinary frequency persists.  Jaynee Eagles, PA-C [facility] [phone] 10/13/2015 1:51 PM

## 2015-10-13 NOTE — Patient Instructions (Addendum)
Drug Rash A drug rash is a change in the color or texture of the skin that is caused by a drug. It can develop minutes, hours, or days after the person takes the drug. CAUSES This condition is usually caused by a drug allergy. It can also be caused by exposure to sunlight after taking a drug that makes the skin sensitive to light. Drugs that commonly cause rashes include:  Penicillin.  Antibiotic medicines.  Medicines that treat seizures.  Medicines that treat cancer (chemotherapy).  Aspirin and other nonsteroidal anti-inflammatory drugs (NSAIDs).  Injectable dyes that contain iodine.  Insulin. SYMPTOMS Symptoms of this condition include:  Redness.  Tiny bumps.  Peeling.  Itching.  Itchy welts (hives).  Swelling. The rash may appear on a small area of skin or all over the body. DIAGNOSIS To diagnose the condition, your health care provider will do a physical exam. He or she may also order tests to find out which drug caused the rash. Tests to find the cause of a rash include:  Skin tests.  Blood tests.  Drug challenge. For this test, you stop taking all of the drugs that you do not need to take, and then you start taking them again by adding back one of the drugs at a time. TREATMENT A drug rash may be treated with medicines, including:  Antihistamines. These may be given to relieve itching.  An NSAID. This may be given to reduce swelling and treat pain.  A steroid drug. This may be given to reduce swelling. The rash usually goes away when the person stops taking the drug that caused it. HOME CARE INSTRUCTIONS  Take medicines only as directed by your health care provider.  Let all of your health care providers know about any drug reactions you have had in the past.  If you have hives, take a cool shower or use a cool compress to relieve itchiness. SEEK MEDICAL CARE IF:  You have a fever.  Your rash is not going away.  Your rash gets worse.  Your rash  comes back.  You have wheezing or coughing. SEEK IMMEDIATE MEDICAL CARE IF:  You start to have breathing problems.  You start to have shortness of breath.  You face or throat starts to swell.  You have severe weakness with dizziness or fainting.  You have chest pain.   This information is not intended to replace advice given to you by your health care provider. Make sure you discuss any questions you have with your health care provider.   Document Released: 09/04/2004 Document Revised: 08/18/2014 Document Reviewed: 05/24/2014 Elsevier Interactive Patient Education [employer].  Because you received labwork today, you will receive an invoice from [employer]. Please contact [facility] at [phone] with questions or concerns regarding your invoice. Our billing staff will not be able to assist you with those questions.  You will be contacted with the lab results as soon as they are available. The fastest way to get your results is to activate your My Chart account. Instructions are located on the last page of this paperwork. If you have not heard from [place] regarding the results in 2 weeks, please contact this office.

## 2015-10-15 DIAGNOSIS — Z6827 Body mass index (BMI) 27.0-27.9, adult: Secondary | ICD-10-CM | POA: Diagnosis not present

## 2015-10-15 DIAGNOSIS — R600 Localized edema: Secondary | ICD-10-CM | POA: Diagnosis not present

## 2015-10-15 DIAGNOSIS — T886XXA Anaphylactic reaction due to adverse effect of correct drug or medicament properly administered, initial encounter: Secondary | ICD-10-CM | POA: Diagnosis not present

## 2015-10-15 DIAGNOSIS — T783XXA Angioneurotic edema, initial encounter: Secondary | ICD-10-CM | POA: Diagnosis not present

## 2015-11-16 DIAGNOSIS — Z961 Presence of intraocular lens: Secondary | ICD-10-CM | POA: Diagnosis not present

## 2015-12-20 DIAGNOSIS — E041 Nontoxic single thyroid nodule: Secondary | ICD-10-CM | POA: Diagnosis not present

## 2015-12-20 DIAGNOSIS — E784 Other hyperlipidemia: Secondary | ICD-10-CM | POA: Diagnosis not present

## 2015-12-20 DIAGNOSIS — M859 Disorder of bone density and structure, unspecified: Secondary | ICD-10-CM | POA: Diagnosis not present

## 2015-12-26 ENCOUNTER — Other Ambulatory Visit: Payer: Self-pay

## 2015-12-26 DIAGNOSIS — Z1231 Encounter for screening mammogram for malignant neoplasm of breast: Secondary | ICD-10-CM

## 2015-12-27 DIAGNOSIS — Z1389 Encounter for screening for other disorder: Secondary | ICD-10-CM | POA: Diagnosis not present

## 2015-12-27 DIAGNOSIS — H4089 Other specified glaucoma: Secondary | ICD-10-CM | POA: Diagnosis not present

## 2015-12-27 DIAGNOSIS — Z6827 Body mass index (BMI) 27.0-27.9, adult: Secondary | ICD-10-CM | POA: Diagnosis not present

## 2015-12-27 DIAGNOSIS — F0390 Unspecified dementia without behavioral disturbance: Secondary | ICD-10-CM | POA: Diagnosis not present

## 2015-12-27 DIAGNOSIS — E042 Nontoxic multinodular goiter: Secondary | ICD-10-CM | POA: Diagnosis not present

## 2015-12-27 DIAGNOSIS — F329 Major depressive disorder, single episode, unspecified: Secondary | ICD-10-CM | POA: Diagnosis not present

## 2015-12-27 DIAGNOSIS — D329 Benign neoplasm of meninges, unspecified: Secondary | ICD-10-CM | POA: Diagnosis not present

## 2015-12-27 DIAGNOSIS — R600 Localized edema: Secondary | ICD-10-CM | POA: Diagnosis not present

## 2015-12-27 DIAGNOSIS — E784 Other hyperlipidemia: Secondary | ICD-10-CM | POA: Diagnosis not present

## 2015-12-27 DIAGNOSIS — M1990 Unspecified osteoarthritis, unspecified site: Secondary | ICD-10-CM | POA: Diagnosis not present

## 2015-12-27 DIAGNOSIS — Z0000 Encounter for general adult medical examination without abnormal findings: Secondary | ICD-10-CM | POA: Diagnosis not present

## 2015-12-27 DIAGNOSIS — G4709 Other insomnia: Secondary | ICD-10-CM | POA: Diagnosis not present

## 2016-01-03 DIAGNOSIS — Z1212 Encounter for screening for malignant neoplasm of rectum: Secondary | ICD-10-CM | POA: Diagnosis not present

## 2016-01-16 ENCOUNTER — Ambulatory Visit
Admission: RE | Admit: 2016-01-16 | Discharge: 2016-01-16 | Disposition: A | Discharge status: Home / Self Care | Payer: Medicare Other | Source: Ambulatory Visit

## 2016-01-16 DIAGNOSIS — Z1231 Encounter for screening mammogram for malignant neoplasm of breast: Secondary | ICD-10-CM

## 2016-01-18 ENCOUNTER — Ambulatory Visit: Payer: Medicare Other

## 2016-02-04 DIAGNOSIS — L989 Disorder of the skin and subcutaneous tissue, unspecified: Secondary | ICD-10-CM | POA: Diagnosis not present

## 2016-02-04 DIAGNOSIS — Z6827 Body mass index (BMI) 27.0-27.9, adult: Secondary | ICD-10-CM | POA: Diagnosis not present

## 2016-02-22 DIAGNOSIS — Z85828 Personal history of other malignant neoplasm of skin: Secondary | ICD-10-CM | POA: Diagnosis not present

## 2016-02-22 DIAGNOSIS — D1801 Hemangioma of skin and subcutaneous tissue: Secondary | ICD-10-CM | POA: Diagnosis not present

## 2016-02-22 DIAGNOSIS — D485 Neoplasm of uncertain behavior of skin: Secondary | ICD-10-CM | POA: Diagnosis not present

## 2016-02-22 DIAGNOSIS — L570 Actinic keratosis: Secondary | ICD-10-CM | POA: Diagnosis not present

## 2016-02-22 DIAGNOSIS — L821 Other seborrheic keratosis: Secondary | ICD-10-CM | POA: Diagnosis not present

## 2016-02-22 DIAGNOSIS — L814 Other melanin hyperpigmentation: Secondary | ICD-10-CM | POA: Diagnosis not present

## 2016-02-22 DIAGNOSIS — D225 Melanocytic nevi of trunk: Secondary | ICD-10-CM | POA: Diagnosis not present

## 2016-04-28 DIAGNOSIS — Z23 Encounter for immunization: Secondary | ICD-10-CM | POA: Diagnosis not present

## 2016-05-01 DIAGNOSIS — R300 Dysuria: Secondary | ICD-10-CM | POA: Diagnosis not present

## 2016-06-12 DIAGNOSIS — F0390 Unspecified dementia without behavioral disturbance: Secondary | ICD-10-CM | POA: Diagnosis not present

## 2016-06-12 DIAGNOSIS — E784 Other hyperlipidemia: Secondary | ICD-10-CM | POA: Diagnosis not present

## 2016-06-12 DIAGNOSIS — M859 Disorder of bone density and structure, unspecified: Secondary | ICD-10-CM | POA: Diagnosis not present

## 2016-06-12 DIAGNOSIS — Z6827 Body mass index (BMI) 27.0-27.9, adult: Secondary | ICD-10-CM | POA: Diagnosis not present

## 2016-06-12 DIAGNOSIS — F325 Major depressive disorder, single episode, in full remission: Secondary | ICD-10-CM | POA: Diagnosis not present

## 2016-06-12 DIAGNOSIS — M1990 Unspecified osteoarthritis, unspecified site: Secondary | ICD-10-CM | POA: Diagnosis not present

## 2016-11-17 DIAGNOSIS — H524 Presbyopia: Secondary | ICD-10-CM | POA: Diagnosis not present

## 2016-11-17 DIAGNOSIS — Z961 Presence of intraocular lens: Secondary | ICD-10-CM | POA: Diagnosis not present

## 2016-12-11 ENCOUNTER — Other Ambulatory Visit: Payer: Self-pay | Admitting: Internal Medicine

## 2016-12-11 DIAGNOSIS — Z1231 Encounter for screening mammogram for malignant neoplasm of breast: Secondary | ICD-10-CM

## 2016-12-23 DIAGNOSIS — E041 Nontoxic single thyroid nodule: Secondary | ICD-10-CM | POA: Diagnosis not present

## 2016-12-23 DIAGNOSIS — E784 Other hyperlipidemia: Secondary | ICD-10-CM | POA: Diagnosis not present

## 2016-12-23 DIAGNOSIS — M859 Disorder of bone density and structure, unspecified: Secondary | ICD-10-CM | POA: Diagnosis not present

## 2016-12-30 DIAGNOSIS — Z6827 Body mass index (BMI) 27.0-27.9, adult: Secondary | ICD-10-CM | POA: Diagnosis not present

## 2016-12-30 DIAGNOSIS — Z0000 Encounter for general adult medical examination without abnormal findings: Secondary | ICD-10-CM | POA: Diagnosis not present

## 2016-12-30 DIAGNOSIS — H4089 Other specified glaucoma: Secondary | ICD-10-CM | POA: Diagnosis not present

## 2016-12-30 DIAGNOSIS — G4709 Other insomnia: Secondary | ICD-10-CM | POA: Diagnosis not present

## 2016-12-30 DIAGNOSIS — E042 Nontoxic multinodular goiter: Secondary | ICD-10-CM | POA: Diagnosis not present

## 2016-12-30 DIAGNOSIS — E784 Other hyperlipidemia: Secondary | ICD-10-CM | POA: Diagnosis not present

## 2016-12-30 DIAGNOSIS — M1990 Unspecified osteoarthritis, unspecified site: Secondary | ICD-10-CM | POA: Diagnosis not present

## 2016-12-30 DIAGNOSIS — D329 Benign neoplasm of meninges, unspecified: Secondary | ICD-10-CM | POA: Diagnosis not present

## 2016-12-30 DIAGNOSIS — Z1389 Encounter for screening for other disorder: Secondary | ICD-10-CM | POA: Diagnosis not present

## 2016-12-30 DIAGNOSIS — F325 Major depressive disorder, single episode, in full remission: Secondary | ICD-10-CM | POA: Diagnosis not present

## 2016-12-30 DIAGNOSIS — M545 Low back pain: Secondary | ICD-10-CM | POA: Diagnosis not present

## 2016-12-30 DIAGNOSIS — F0390 Unspecified dementia without behavioral disturbance: Secondary | ICD-10-CM | POA: Diagnosis not present

## 2017-01-16 ENCOUNTER — Ambulatory Visit
Admission: RE | Admit: 2017-01-16 | Discharge: 2017-01-16 | Disposition: A | Discharge status: Home / Self Care | Payer: Medicare Other | Source: Ambulatory Visit | Attending: Internal Medicine | Admitting: Internal Medicine

## 2017-01-16 DIAGNOSIS — Z1231 Encounter for screening mammogram for malignant neoplasm of breast: Secondary | ICD-10-CM | POA: Diagnosis not present

## 2017-02-05 DIAGNOSIS — K5900 Constipation, unspecified: Secondary | ICD-10-CM | POA: Diagnosis not present

## 2017-02-05 DIAGNOSIS — N390 Urinary tract infection, site not specified: Secondary | ICD-10-CM | POA: Diagnosis not present

## 2017-02-05 DIAGNOSIS — R10817 Generalized abdominal tenderness: Secondary | ICD-10-CM | POA: Diagnosis not present

## 2017-02-05 DIAGNOSIS — R10814 Left lower quadrant abdominal tenderness: Secondary | ICD-10-CM | POA: Diagnosis not present

## 2017-03-26 DIAGNOSIS — R220 Localized swelling, mass and lump, head: Secondary | ICD-10-CM | POA: Diagnosis not present

## 2017-03-26 DIAGNOSIS — Z6827 Body mass index (BMI) 27.0-27.9, adult: Secondary | ICD-10-CM | POA: Diagnosis not present

## 2017-05-07 DIAGNOSIS — D2371 Other benign neoplasm of skin of right lower limb, including hip: Secondary | ICD-10-CM | POA: Diagnosis not present

## 2017-05-07 DIAGNOSIS — L814 Other melanin hyperpigmentation: Secondary | ICD-10-CM | POA: Diagnosis not present

## 2017-05-07 DIAGNOSIS — D1801 Hemangioma of skin and subcutaneous tissue: Secondary | ICD-10-CM | POA: Diagnosis not present

## 2017-05-07 DIAGNOSIS — Z85828 Personal history of other malignant neoplasm of skin: Secondary | ICD-10-CM | POA: Diagnosis not present

## 2017-05-07 DIAGNOSIS — L821 Other seborrheic keratosis: Secondary | ICD-10-CM | POA: Diagnosis not present

## 2017-05-07 DIAGNOSIS — D692 Other nonthrombocytopenic purpura: Secondary | ICD-10-CM | POA: Diagnosis not present

## 2017-06-09 ENCOUNTER — Ambulatory Visit (HOSPITAL_COMMUNITY)
Admission: EM | Admit: 2017-06-09 | Discharge: 2017-06-09 | Disposition: A | Discharge status: Home / Self Care | Payer: Medicare Other | Attending: Family Medicine | Admitting: Family Medicine

## 2017-06-09 ENCOUNTER — Encounter (HOSPITAL_COMMUNITY): Payer: Self-pay | Admitting: Emergency Medicine

## 2017-06-09 DIAGNOSIS — R300 Dysuria: Secondary | ICD-10-CM

## 2017-06-09 DIAGNOSIS — R102 Pelvic and perineal pain: Secondary | ICD-10-CM

## 2017-06-09 LAB — POCT URINALYSIS DIP (DEVICE)
Bilirubin Urine: NEGATIVE
GLUCOSE, UA: NEGATIVE mg/dL
Hgb urine dipstick: NEGATIVE
KETONES UR: NEGATIVE mg/dL
Leukocytes, UA: NEGATIVE
Nitrite: NEGATIVE
PROTEIN: NEGATIVE mg/dL
SPECIFIC GRAVITY, URINE: 1.01 (ref 1.005–1.030)
Urobilinogen, UA: 0.2 mg/dL (ref 0.0–1.0)
pH: 6 (ref 5.0–8.0)

## 2017-06-09 NOTE — ED Provider Notes (Signed)
[place]    CSN: [id number] Arrival date & time: 06/09/17  1720     History   Chief Complaint Chief Complaint  Patient presents with  . Urinary Tract Infection  . Dysuria    HPI Gina Edwards is a 76 y.o. female.   76 year-old female, with history of dementia, presenting today with husband due to dysuria. He states that they were out eating dinner this evening and she began to cry and complain of lower abdominal pain. She told her husband "it feels like a period" and that she had some bleeding. Patient denies all of this now and states that she has no complaints currently. Denies abdominal pain, dysuria, haematuria, flank pain, fever, chill, nausea and vomiting   The history is provided by the patient and the spouse.  Urinary Tract Infection  Pain quality:  Burning Pain severity:  Mild Onset quality:  Gradual Duration:  2 hours Timing:  Constant Progression:  Resolved Chronicity:  New Recent urinary tract infections: no   Relieved by:  Nothing Worsened by:  Nothing Ineffective treatments:  None tried Urinary symptoms: no discolored urine, no foul-smelling urine, no frequent urination, no hematuria, no hesitancy and no bladder incontinence   Associated symptoms: no abdominal pain, no fever and no vomiting   Risk factors: no hx of pyelonephritis, no hx of urolithiasis, no kidney transplant, not pregnant, no recurrent urinary tract infections and no renal cysts     Past Medical History:  Diagnosis Date  . Anxiety 12/02/2012  . Anxiety and depression   . Dementia 12/02/2012  . Depression 12/02/2012  . H/O meningioma of the brain 12/02/2012  . SUI (stress urinary incontinence, female)     Patient Active Problem List   Diagnosis Date Noted  . Dementia 12/02/2012  . Depression 12/02/2012  . Anxiety 12/02/2012  . H/O meningioma of the brain 12/02/2012    Past Surgical History:  Procedure Laterality Date  . APPENDECTOMY  1957  . TUBAL LIGATION Bilateral  1975    OB History    Gravida Para Term Preterm AB Living   1 1 1     1    SAB TAB Ectopic Multiple Live Births                   Home Medications    Prior to Admission medications   Medication Sig Start Date End Date Taking? Authorizing Provider  ABILIFY 2 MG tablet Take 1 tablet by mouth daily. 11/29/12  Yes [provider]  ALPRAZolam Duanne Moron) 1 MG tablet Take 1 tablet by mouth daily. 10/04/12  Yes [provider]  escitalopram (LEXAPRO) 10 MG tablet Take 2 tablets by mouth daily. 09/14/12  Yes [provider]  esomeprazole (NEXIUM) 40 MG capsule Take 40 mg by mouth daily at 12 noon.   Yes [provider]    Family History Family History  Problem Relation Age of Onset  . Heart disease Mother   . Colon cancer Mother 11  . Prostate cancer Father     Social History Social History  Substance Use Topics  . Smoking status: Never Smoker  . Smokeless tobacco: Never Used  . Alcohol use No     Allergies   Ciprofloxacin and Morphine and related   Review of Systems Review of Systems  Constitutional: Negative for chills and fever.  HENT: Negative for ear pain and sore throat.   Eyes: Negative for pain and visual disturbance.  Respiratory: Negative for cough and shortness  of breath.   Cardiovascular: Negative for chest pain and palpitations.  Gastrointestinal: Negative for abdominal pain and vomiting.  Genitourinary: Negative for dysuria and hematuria.  Musculoskeletal: Negative for arthralgias and back pain.  Skin: Negative for color change and rash.  Neurological: Negative for seizures and syncope.  All other systems reviewed and are negative.    Physical Exam Triage Vital Signs ED Triage Vitals [06/09/17 1743]  Enc Vitals Group     BP (!) 156/79     Pulse Rate 84     Resp 16     Temp 97.6 F (36.4 C)     Temp Source Oral     SpO2 98 %     Weight      Height      Head Circumference      Peak Flow      Pain Score      Pain  Loc      Pain Edu?      Excl. in [place]?    No data found.   Updated Vital Signs BP (!) 156/79 (BP Location: Left Arm)   Pulse 84   Temp 97.6 F (36.4 C) (Oral)   Resp 16   SpO2 98%   Visual Acuity Right Eye Distance:   Left Eye Distance:   Bilateral Distance:    Right Eye Near:   Left Eye Near:    Bilateral Near:     Physical Exam  Constitutional: She appears well-developed and well-nourished. No distress.  HENT:  Head: Normocephalic and atraumatic.  Eyes: Conjunctivae are normal.  Neck: Neck supple.  Cardiovascular: Normal rate and regular rhythm.   No murmur heard. Pulmonary/Chest: Effort normal and breath sounds normal. No respiratory distress.  Abdominal: Soft. There is no tenderness. There is no CVA tenderness.  Musculoskeletal: She exhibits no edema.  Neurological: She is alert.  Skin: Skin is warm and dry.  Psychiatric: She has a normal mood and affect.  Nursing note and vitals reviewed.    UC Treatments / Results  Labs (all labs ordered are listed, but only abnormal results are displayed) Labs Reviewed  POCT URINALYSIS DIP (DEVICE)    EKG  EKG Interpretation None       Radiology No results found.  Procedures Procedures (including critical care time)  Medications Ordered in UC Medications - No data to display   Initial Impression / Assessment and Plan / UC Course  I have reviewed the triage vital signs and the nursing notes.  Pertinent labs & imaging results that were available during my care of the patient were reviewed by me and considered in my medical decision making (see chart for details).     Dysuria, hematuria and suprapubic pain while at dinner tonight  She denies all complaints at this time and states that she did not actually have any symptoms earlier this evening. UA without microscopic hematuria or evidence of infection  Final Clinical Impressions(s) / UC Diagnoses   Final diagnoses:  Dysuria    New Prescriptions New  Prescriptions   No medications on file     Controlled Substance Prescriptions [place] Controlled Substance Registry consulted? Not Applicable   Phebe Colla, [place] 06/09/17 7510

## 2017-06-09 NOTE — ED Triage Notes (Signed)
Pt thinks she has a UTI. Pt c/o pain in bladder area.

## 2017-06-30 DIAGNOSIS — K5909 Other constipation: Secondary | ICD-10-CM | POA: Diagnosis not present

## 2017-06-30 DIAGNOSIS — Z6826 Body mass index (BMI) 26.0-26.9, adult: Secondary | ICD-10-CM | POA: Diagnosis not present

## 2017-06-30 DIAGNOSIS — M1990 Unspecified osteoarthritis, unspecified site: Secondary | ICD-10-CM | POA: Diagnosis not present

## 2017-06-30 DIAGNOSIS — F325 Major depressive disorder, single episode, in full remission: Secondary | ICD-10-CM | POA: Diagnosis not present

## 2017-06-30 DIAGNOSIS — E042 Nontoxic multinodular goiter: Secondary | ICD-10-CM | POA: Diagnosis not present

## 2017-06-30 DIAGNOSIS — D72819 Decreased white blood cell count, unspecified: Secondary | ICD-10-CM | POA: Diagnosis not present

## 2017-06-30 DIAGNOSIS — Z23 Encounter for immunization: Secondary | ICD-10-CM | POA: Diagnosis not present

## 2017-06-30 DIAGNOSIS — H4089 Other specified glaucoma: Secondary | ICD-10-CM | POA: Diagnosis not present

## 2017-06-30 DIAGNOSIS — E7849 Other hyperlipidemia: Secondary | ICD-10-CM | POA: Diagnosis not present

## 2017-06-30 DIAGNOSIS — F0390 Unspecified dementia without behavioral disturbance: Secondary | ICD-10-CM | POA: Diagnosis not present

## 2017-11-26 DIAGNOSIS — H5212 Myopia, left eye: Secondary | ICD-10-CM | POA: Diagnosis not present

## 2017-11-26 DIAGNOSIS — Z961 Presence of intraocular lens: Secondary | ICD-10-CM | POA: Diagnosis not present

## 2017-12-08 ENCOUNTER — Encounter (HOSPITAL_COMMUNITY): Payer: Self-pay | Admitting: Emergency Medicine

## 2017-12-08 ENCOUNTER — Emergency Department (HOSPITAL_COMMUNITY)
Admission: EM | Admit: 2017-12-08 | Discharge: 2017-12-08 | Disposition: A | Discharge status: Home / Self Care | Payer: Medicare Other | Attending: Emergency Medicine | Admitting: Emergency Medicine

## 2017-12-08 ENCOUNTER — Emergency Department (HOSPITAL_COMMUNITY): Payer: Medicare Other

## 2017-12-08 DIAGNOSIS — R1011 Right upper quadrant pain: Secondary | ICD-10-CM

## 2017-12-08 DIAGNOSIS — F4489 Other dissociative and conversion disorders: Secondary | ICD-10-CM | POA: Diagnosis not present

## 2017-12-08 DIAGNOSIS — K5900 Constipation, unspecified: Secondary | ICD-10-CM | POA: Diagnosis not present

## 2017-12-08 DIAGNOSIS — N3001 Acute cystitis with hematuria: Secondary | ICD-10-CM | POA: Diagnosis not present

## 2017-12-08 DIAGNOSIS — Z79899 Other long term (current) drug therapy: Secondary | ICD-10-CM | POA: Insufficient documentation

## 2017-12-08 DIAGNOSIS — R109 Unspecified abdominal pain: Secondary | ICD-10-CM | POA: Diagnosis present

## 2017-12-08 DIAGNOSIS — R1084 Generalized abdominal pain: Secondary | ICD-10-CM | POA: Diagnosis not present

## 2017-12-08 DIAGNOSIS — F0390 Unspecified dementia without behavioral disturbance: Secondary | ICD-10-CM | POA: Diagnosis not present

## 2017-12-08 LAB — COMPREHENSIVE METABOLIC PANEL
ALK PHOS: 75 U/L (ref 38–126)
ALT: 11 U/L — AB (ref 14–54)
AST: 16 U/L (ref 15–41)
Albumin: 3.5 g/dL (ref 3.5–5.0)
Anion gap: 10 (ref 5–15)
BILIRUBIN TOTAL: 2.1 mg/dL — AB (ref 0.3–1.2)
BUN: 14 mg/dL (ref 6–20)
CALCIUM: 8.7 mg/dL — AB (ref 8.9–10.3)
CHLORIDE: 105 mmol/L (ref 101–111)
CO2: 24 mmol/L (ref 22–32)
Creatinine, Ser: 0.88 mg/dL (ref 0.44–1.00)
Glucose, Bld: 106 mg/dL — ABNORMAL HIGH (ref 65–99)
Potassium: 4.1 mmol/L (ref 3.5–5.1)
Sodium: 139 mmol/L (ref 135–145)
TOTAL PROTEIN: 6.3 g/dL — AB (ref 6.5–8.1)

## 2017-12-08 LAB — URINALYSIS, ROUTINE W REFLEX MICROSCOPIC
Bacteria, UA: NONE SEEN
Bilirubin Urine: NEGATIVE
GLUCOSE, UA: NEGATIVE mg/dL
KETONES UR: NEGATIVE mg/dL
Nitrite: NEGATIVE
PH: 8 (ref 5.0–8.0)
Protein, ur: NEGATIVE mg/dL
SPECIFIC GRAVITY, URINE: 1.01 (ref 1.005–1.030)
WBC, UA: >50 WBC/hpf — ABNORMAL HIGH (ref 0–5)

## 2017-12-08 LAB — CBC WITH DIFFERENTIAL/PLATELET
Basophils Absolute: 0 10*3/uL (ref 0.0–0.1)
Basophils Relative: 0 %
Eosinophils Absolute: 0.1 10*3/uL (ref 0.0–0.7)
Eosinophils Relative: 1 %
HEMATOCRIT: 41.4 % (ref 36.0–46.0)
Hemoglobin: 13.5 g/dL (ref 12.0–15.0)
LYMPHS ABS: 1.7 10*3/uL (ref 0.7–4.0)
LYMPHS PCT: 15 %
MCH: 30.8 pg (ref 26.0–34.0)
MCHC: 32.6 g/dL (ref 30.0–36.0)
MCV: 94.5 fL (ref 78.0–100.0)
Monocytes Absolute: 1.3 10*3/uL — ABNORMAL HIGH (ref 0.1–1.0)
Monocytes Relative: 12 %
NEUTROS ABS: 8 10*3/uL — AB (ref 1.7–7.7)
Neutrophils Relative %: 72 %
PLATELETS: 156 10*3/uL (ref 150–400)
RBC: 4.38 MIL/uL (ref 3.87–5.11)
RDW: 13.6 % (ref 11.5–15.5)
WBC: 11 10*3/uL — AB (ref 4.0–10.5)

## 2017-12-08 LAB — LIPASE, BLOOD: Lipase: 30 U/L (ref 11–51)

## 2017-12-08 MED ORDER — CEPHALEXIN 500 MG PO CAPS: 500.0000 mg | ORAL_CAPSULE | Freq: Three times a day (TID) | ORAL | 0 refills | Status: DC

## 2017-12-08 MED ORDER — LIDOCAINE HCL (PF) 1 % IJ SOLN
INTRAMUSCULAR | Status: AC
  Administered 2017-12-08: 2.1 mL
  Filled 2017-12-08: qty 30

## 2017-12-08 MED ORDER — CEFTRIAXONE SODIUM 1 G IJ SOLR
1.0000 g | Freq: Once | INTRAMUSCULAR | Status: AC
  Administered 2017-12-08: 1 g via INTRAMUSCULAR
  Filled 2017-12-08: qty 10

## 2017-12-08 NOTE — Discharge Instructions (Addendum)
You were seen today for abdominal pain.  You may have a urinary tract infection.  Otherwise your work-up is reassuring.  Take medications as prescribed.  Follow-up with your primary physician.

## 2017-12-08 NOTE — ED Triage Notes (Signed)
Per EMS, pt. Fr om home with complaint of RUQ pain which started at 1am today. Per Pt's husband , pt. Took laxative yesterday for constipation, reported it was effecitive. Has dementia. No report of diarrhea nor N/V. Denied fever.

## 2017-12-08 NOTE — ED Provider Notes (Signed)
[facility] DEPT Provider Note   CSN: [id number] Arrival date & time: 12/08/17  0221     History   Chief Complaint Chief Complaint  Patient presents with  . Abdominal Pain    HPI Gina Edwards is a 77 y.o. female.  HPI  This is a 77 year old female with a history of dementia who presents with her husband with concerns for abdominal pain.  Husband reports that she went to sleep at her baseline.  However, he woke up to her curled up in bed.  At that time she reported right upper quadrant pain to him.  For EMS she reported lower abdominal pain.  She has not had any nausea, vomiting.  Husband does report recent difficulty having bowel movements.  He gave her 2 laxatives yesterday without any results.  At baseline, patient is only oriented to herself.  She denies any pain to me.  She does not contribute to history taking.  No reported fevers.  L5 caveat for dementia  Past Medical History:  Diagnosis Date  . Anxiety 12/02/2012  . Anxiety and depression   . Dementia 12/02/2012  . Depression 12/02/2012  . H/O meningioma of the brain 12/02/2012  . SUI (stress urinary incontinence, female)     Patient Active Problem List   Diagnosis Date Noted  . Dementia 12/02/2012  . Depression 12/02/2012  . Anxiety 12/02/2012  . H/O meningioma of the brain 12/02/2012    Past Surgical History:  Procedure Laterality Date  . APPENDECTOMY  1957  . TUBAL LIGATION Bilateral 1975     OB History    Gravida  1   Para  1   Term  1   Preterm      AB      Living  1     SAB      TAB      Ectopic      Multiple      Live Births               Home Medications    Prior to Admission medications   Medication Sig Start Date End Date Taking? Authorizing Provider  ABILIFY 2 MG tablet Take 1 tablet by mouth daily. 11/29/12  Yes [provider]  ALPRAZolam Duanne Moron) 1 MG tablet Take 1 tablet by mouth daily. 10/04/12  Yes [provider]    escitalopram (LEXAPRO) 10 MG tablet Take 10 mg by mouth daily.  09/14/12  Yes [provider]  esomeprazole (NEXIUM) 40 MG capsule Take 40 mg by mouth every other day.    Yes [provider]  cephALEXin (KEFLEX) 500 MG capsule Take 1 capsule (500 mg total) by mouth 3 (three) times daily. 12/08/17   Clairessa Boulet, Barbette Hair, MD    Family History Family History  Problem Relation Age of Onset  . Heart disease Mother   . Colon cancer Mother 40  . Prostate cancer Father     Social History Social History   Tobacco Use  . Smoking status: Never Smoker  . Smokeless tobacco: Never Used  Substance Use Topics  . Alcohol use: No  . Drug use: No     Allergies   Ciprofloxacin and Morphine and related   Review of Systems Review of Systems  Unable to perform ROS: Dementia     Physical Exam Updated Vital Signs BP (!) 153/78 (BP Location: Right Arm)   Pulse 74   Temp (!) 97.5 F (36.4 C) (Oral)   Resp 17  SpO2 98%   Physical Exam  Constitutional: She is oriented to person, place, and time. She appears well-developed and well-nourished.  Elderly, nontoxic-appearing, no acute distress  HENT:  Head: Normocephalic and atraumatic.  Eyes: Pupils are equal, round, and reactive to light.  Neck: Neck supple.  Cardiovascular: Normal rate, regular rhythm and normal heart sounds.  Pulmonary/Chest: Effort normal. No respiratory distress. She has no wheezes.  Abdominal: Soft. Bowel sounds are normal. There is no tenderness. There is no rebound and no guarding.  Neurological: She is alert and oriented to person, place, and time.  Skin: Skin is warm and dry.  Psychiatric: She has a normal mood and affect.  Nursing note and vitals reviewed.    ED Treatments / Results  Labs (all labs ordered are listed, but only abnormal results are displayed) Labs Reviewed  CBC WITH DIFFERENTIAL/PLATELET - Abnormal; Notable for the following components:      Result Value   WBC 11.0 (*)     Neutro Abs 8.0 (*)    Monocytes Absolute 1.3 (*)    All other components within normal limits  COMPREHENSIVE METABOLIC PANEL - Abnormal; Notable for the following components:   Glucose, Bld 106 (*)    Calcium 8.7 (*)    Total Protein 6.3 (*)    ALT 11 (*)    Total Bilirubin 2.1 (*)    All other components within normal limits  URINALYSIS, ROUTINE W REFLEX MICROSCOPIC - Abnormal; Notable for the following components:   Color, Urine STRAW (*)    APPearance HAZY (*)    Hgb urine dipstick MODERATE (*)    Leukocytes, UA LARGE (*)    WBC, UA >50 (*)    All other components within normal limits  URINE CULTURE  LIPASE, BLOOD    EKG None  Radiology Dg Abdomen Acute W/chest  Result Date: 12/08/2017 CLINICAL DATA:  Abdomen pain with constipation EXAM: DG ABDOMEN ACUTE W/ 1V CHEST COMPARISON:  03/02/2010 FINDINGS: Single-view chest demonstrates hyperinflation. No focal opacity. Normal heart size. Supine and upright views of the abdomen demonstrate no free air beneath the diaphragm. Overall decreased bowel gas with a small amount of gas present in the rectum and colon. Vascular calcifications. Possible sclerotic lesion in the left acetabulum. IMPRESSION: 1. No radiographic evidence for acute cardiopulmonary abnormality. 2. Nonspecific diffuse decreased bowel gas. Electronically Signed   By: Donavan Foil M.D.   On: 12/08/2017 04:04    Procedures Procedures (including critical care time)  Medications Ordered in ED Medications  cefTRIAXone (ROCEPHIN) injection 1 g (1 g Intramuscular Given 12/08/17 0555)  lidocaine (PF) (XYLOCAINE) 1 % injection (2.1 mLs  Given 12/08/17 0555)     Initial Impression / Assessment and Plan / ED Course  I have reviewed the triage vital signs and the nursing notes.  Pertinent labs & imaging results that were available during my care of the patient were reviewed by me and considered in my medical decision making (see chart for details).     Presents with  abdominal pain.  She is currently without complaint; however, she contributes minimal to history taking.  She has a history of dementia.  Husband reports that she is at her baseline.  Her abdomen is soft nontender.  Vital signs reviewed and are reassuring.  She is afebrile.  Basic lab work largely reassuring.  Mild leukocytosis to 11.  Urinalysis shows large leukocyte esterase, greater than 50 white cells.  We will culture and treat.  This could be the etiology of  the patient's symptoms.  She has remained stable  and without complaint.  Belly remains benign.  Doubt cholecystitis, appendicitis, pancreatitis.  After history, exam, and medical workup I feel the patient has been appropriately medically screened and is safe for discharge home. Pertinent diagnoses were discussed with the patient. Patient was given return precautions.   Final Clinical Impressions(s) / ED Diagnoses   Final diagnoses:  Right upper quadrant abdominal pain  Acute cystitis with hematuria    ED Discharge Orders        Ordered    cephALEXin (KEFLEX) 500 MG capsule  3 times daily     12/08/17 0605       Merryl Hacker, MD 12/08/17 [phone]

## 2017-12-08 NOTE — ED Notes (Signed)
Bed: [facility] Expected date:  Expected time:  Means of arrival:  Comments: EMS 77 yo female abd pain x 1 hour/dementia

## 2017-12-10 LAB — URINE CULTURE: Culture: 20000 — AB

## 2017-12-11 ENCOUNTER — Telehealth: Payer: Self-pay | Admitting: *Deleted

## 2017-12-11 NOTE — Telephone Encounter (Signed)
Post ED Visit - Positive Culture Follow-up  Culture report reviewed by antimicrobial stewardship pharmacist:  []  Elenor Quinones, Pharm.D. []  Heide Guile, Pharm.D., BCPS AQ-ID []  Parks Neptune, Pharm.D., BCPS []  Alycia Rossetti, Pharm.D., BCPS []  [place], Pharm.D., BCPS, AAHIVP []  Legrand Como, Pharm.D., BCPS, AAHIVP []  Salome Arnt, PharmD, BCPS [x]  Wynell Balloon, PharmD []  Vincenza Hews, PharmD, BCPS  Positive urine culture Treated with Cephalexin, organism sensitive to the same and no further patient follow-up is required at this time.  Harlon Flor [facility] 12/11/2017, 12:02 PM

## 2017-12-14 ENCOUNTER — Other Ambulatory Visit: Payer: Self-pay | Admitting: Internal Medicine

## 2017-12-14 DIAGNOSIS — Z1231 Encounter for screening mammogram for malignant neoplasm of breast: Secondary | ICD-10-CM

## 2017-12-24 DIAGNOSIS — E042 Nontoxic multinodular goiter: Secondary | ICD-10-CM | POA: Diagnosis not present

## 2017-12-24 DIAGNOSIS — M859 Disorder of bone density and structure, unspecified: Secondary | ICD-10-CM | POA: Diagnosis not present

## 2017-12-24 DIAGNOSIS — E7849 Other hyperlipidemia: Secondary | ICD-10-CM | POA: Diagnosis not present

## 2017-12-31 DIAGNOSIS — H4089 Other specified glaucoma: Secondary | ICD-10-CM | POA: Diagnosis not present

## 2017-12-31 DIAGNOSIS — F325 Major depressive disorder, single episode, in full remission: Secondary | ICD-10-CM | POA: Diagnosis not present

## 2017-12-31 DIAGNOSIS — M1990 Unspecified osteoarthritis, unspecified site: Secondary | ICD-10-CM | POA: Diagnosis not present

## 2017-12-31 DIAGNOSIS — R634 Abnormal weight loss: Secondary | ICD-10-CM | POA: Diagnosis not present

## 2017-12-31 DIAGNOSIS — F0390 Unspecified dementia without behavioral disturbance: Secondary | ICD-10-CM | POA: Diagnosis not present

## 2017-12-31 DIAGNOSIS — E042 Nontoxic multinodular goiter: Secondary | ICD-10-CM | POA: Diagnosis not present

## 2017-12-31 DIAGNOSIS — M545 Low back pain: Secondary | ICD-10-CM | POA: Diagnosis not present

## 2017-12-31 DIAGNOSIS — R82998 Other abnormal findings in urine: Secondary | ICD-10-CM | POA: Diagnosis not present

## 2017-12-31 DIAGNOSIS — Z6825 Body mass index (BMI) 25.0-25.9, adult: Secondary | ICD-10-CM | POA: Diagnosis not present

## 2017-12-31 DIAGNOSIS — Z1389 Encounter for screening for other disorder: Secondary | ICD-10-CM | POA: Diagnosis not present

## 2017-12-31 DIAGNOSIS — K5909 Other constipation: Secondary | ICD-10-CM | POA: Diagnosis not present

## 2017-12-31 DIAGNOSIS — D329 Benign neoplasm of meninges, unspecified: Secondary | ICD-10-CM | POA: Diagnosis not present

## 2017-12-31 DIAGNOSIS — Z0000 Encounter for general adult medical examination without abnormal findings: Secondary | ICD-10-CM | POA: Diagnosis not present

## 2018-01-18 ENCOUNTER — Ambulatory Visit
Admission: RE | Admit: 2018-01-18 | Discharge: 2018-01-18 | Disposition: A | Discharge status: Home / Self Care | Payer: Medicare Other | Source: Ambulatory Visit | Attending: Internal Medicine | Admitting: Internal Medicine

## 2018-01-18 DIAGNOSIS — Z1231 Encounter for screening mammogram for malignant neoplasm of breast: Secondary | ICD-10-CM | POA: Diagnosis not present

## 2018-01-24 ENCOUNTER — Emergency Department (HOSPITAL_COMMUNITY)
Admission: EM | Admit: 2018-01-24 | Discharge: 2018-01-24 | Disposition: A | Discharge status: Home / Self Care | Payer: Medicare Other | Attending: Emergency Medicine | Admitting: Emergency Medicine

## 2018-01-24 ENCOUNTER — Encounter (HOSPITAL_COMMUNITY): Payer: Self-pay | Admitting: Emergency Medicine

## 2018-01-24 DIAGNOSIS — Z79899 Other long term (current) drug therapy: Secondary | ICD-10-CM | POA: Insufficient documentation

## 2018-01-24 DIAGNOSIS — N390 Urinary tract infection, site not specified: Secondary | ICD-10-CM | POA: Insufficient documentation

## 2018-01-24 DIAGNOSIS — E876 Hypokalemia: Secondary | ICD-10-CM | POA: Diagnosis not present

## 2018-01-24 DIAGNOSIS — M6281 Muscle weakness (generalized): Secondary | ICD-10-CM | POA: Insufficient documentation

## 2018-01-24 DIAGNOSIS — F0390 Unspecified dementia without behavioral disturbance: Secondary | ICD-10-CM | POA: Insufficient documentation

## 2018-01-24 DIAGNOSIS — R531 Weakness: Secondary | ICD-10-CM | POA: Diagnosis not present

## 2018-01-24 LAB — COMPREHENSIVE METABOLIC PANEL
ALT: 25 U/L (ref 14–54)
AST: 38 U/L (ref 15–41)
Albumin: 3.4 g/dL — ABNORMAL LOW (ref 3.5–5.0)
Alkaline Phosphatase: 67 U/L (ref 38–126)
Anion gap: 10 (ref 5–15)
BILIRUBIN TOTAL: 2.9 mg/dL — AB (ref 0.3–1.2)
BUN: 14 mg/dL (ref 6–20)
CHLORIDE: 101 mmol/L (ref 101–111)
CO2: 26 mmol/L (ref 22–32)
CREATININE: 1.13 mg/dL — AB (ref 0.44–1.00)
Calcium: 8.6 mg/dL — ABNORMAL LOW (ref 8.9–10.3)
GFR, EST AFRICAN AMERICAN: 53 mL/min — AB (ref >60)
GFR, EST NON AFRICAN AMERICAN: 45 mL/min — AB (ref >60)
Glucose, Bld: 101 mg/dL — ABNORMAL HIGH (ref 65–99)
Potassium: 3.2 mmol/L — ABNORMAL LOW (ref 3.5–5.1)
Sodium: 137 mmol/L (ref 135–145)
TOTAL PROTEIN: 5.9 g/dL — AB (ref 6.5–8.1)

## 2018-01-24 LAB — DIFFERENTIAL
Abs Immature Granulocytes: 0.1 10*3/uL (ref 0.0–0.1)
BASOS PCT: 0 %
Basophils Absolute: 0 10*3/uL (ref 0.0–0.1)
EOS ABS: 0 10*3/uL (ref 0.0–0.7)
Eosinophils Relative: 0 %
Immature Granulocytes: 1 %
LYMPHS ABS: 1.6 10*3/uL (ref 0.7–4.0)
Lymphocytes Relative: 17 %
MONO ABS: 1 10*3/uL (ref 0.1–1.0)
Monocytes Relative: 10 %
NEUTROS ABS: 6.8 10*3/uL (ref 1.7–7.7)
Neutrophils Relative %: 72 %

## 2018-01-24 LAB — I-STAT CHEM 8, ED
BUN: 15 mg/dL (ref 6–20)
CALCIUM ION: 1.13 mmol/L — AB (ref 1.15–1.40)
CREATININE: 1 mg/dL (ref 0.44–1.00)
Chloride: 98 mmol/L — ABNORMAL LOW (ref 101–111)
GLUCOSE: 98 mg/dL (ref 65–99)
HCT: 36 % (ref 36.0–46.0)
HEMOGLOBIN: 12.2 g/dL (ref 12.0–15.0)
Potassium: 3.1 mmol/L — ABNORMAL LOW (ref 3.5–5.1)
Sodium: 137 mmol/L (ref 135–145)
TCO2: 25 mmol/L (ref 22–32)

## 2018-01-24 LAB — PROTIME-INR
INR: 1.16
Prothrombin Time: 14.8 seconds (ref 11.4–15.2)

## 2018-01-24 LAB — CBC
HEMATOCRIT: 38.4 % (ref 36.0–46.0)
HEMOGLOBIN: 12.2 g/dL (ref 12.0–15.0)
MCH: 30.5 pg (ref 26.0–34.0)
MCHC: 31.8 g/dL (ref 30.0–36.0)
MCV: 96 fL (ref 78.0–100.0)
Platelets: 198 10*3/uL (ref 150–400)
RBC: 4 MIL/uL (ref 3.87–5.11)
RDW: 13.3 % (ref 11.5–15.5)
WBC: 9.4 10*3/uL (ref 4.0–10.5)

## 2018-01-24 LAB — I-STAT TROPONIN, ED: TROPONIN I, POC: 0.02 ng/mL (ref 0.00–0.08)

## 2018-01-24 LAB — I-STAT CG4 LACTIC ACID, ED
LACTIC ACID, VENOUS: 1.09 mmol/L (ref 0.5–1.9)
LACTIC ACID, VENOUS: 1.28 mmol/L (ref 0.5–1.9)

## 2018-01-24 LAB — URINALYSIS, ROUTINE W REFLEX MICROSCOPIC
BACTERIA UA: NONE SEEN
Bilirubin Urine: NEGATIVE
GLUCOSE, UA: NEGATIVE mg/dL
KETONES UR: NEGATIVE mg/dL
Nitrite: NEGATIVE
PH: 7 (ref 5.0–8.0)
Protein, ur: NEGATIVE mg/dL
SPECIFIC GRAVITY, URINE: 1.005 (ref 1.005–1.030)
WBC, UA: >50 WBC/hpf — ABNORMAL HIGH (ref 0–5)

## 2018-01-24 LAB — APTT: APTT: 29 s (ref 24–36)

## 2018-01-24 LAB — MAGNESIUM: Magnesium: 1.8 mg/dL (ref 1.7–2.4)

## 2018-01-24 MED ORDER — CEPHALEXIN 500 MG PO CAPS: 500.0000 mg | ORAL_CAPSULE | Freq: Three times a day (TID) | ORAL | 0 refills | Status: DC

## 2018-01-24 MED ORDER — LACTATED RINGERS IV BOLUS
1000.0000 mL | Freq: Once | INTRAVENOUS | Status: AC
  Administered 2018-01-24: 1000 mL via INTRAVENOUS

## 2018-01-24 MED ORDER — SODIUM CHLORIDE 0.9 % IV SOLN
1.0000 g | Freq: Once | INTRAVENOUS | Status: AC
  Administered 2018-01-24: 1 g via INTRAVENOUS
  Filled 2018-01-24: qty 10

## 2018-01-24 MED ORDER — POTASSIUM CHLORIDE 20 MEQ PO PACK: 20.0000 meq | PACK | Freq: Once | ORAL | 0 refills | Status: DC

## 2018-01-24 MED ORDER — POTASSIUM CHLORIDE CRYS ER 20 MEQ PO TBCR
40.0000 meq | EXTENDED_RELEASE_TABLET | Freq: Once | ORAL | Status: DC
  Filled 2018-01-24: qty 2

## 2018-01-24 NOTE — ED Triage Notes (Signed)
Pt arrives with husband, per husband pt is altered at baseline with hx of dementia. Pt has weakness since this morning, husband unsure if she was like this when she woke up but once he saw her up she could not walk up/down the stairs. Pt had a recent UTI and was seen at [facility]. PT unable to tell her last name or age, or time per baseline. Pt also endorses a recent cough.

## 2018-01-24 NOTE — ED Notes (Signed)
Patient verbalizes understanding of discharge instructions. Opportunity for questioning and answers were provided. Armband removed by staff, pt discharged from ED via wheelchair with staff.

## 2018-01-24 NOTE — Discharge Instructions (Addendum)
It appears that Gina Edwards's weakness is because of her UTI.  Please take the antibiotics prescribed. Please have Gina Edwards be seen by the primary care doctor in 1 week.  Please return to the ER if your symptoms worsen; you have increased pain, fevers, chills, inability to keep any medications down, confusion. Otherwise see the outpatient doctor as requested.

## 2018-01-24 NOTE — ED Provider Notes (Addendum)
[facility] EMERGENCY DEPARTMENT Provider Note   CSN: [id number] Arrival date & time: 01/24/18  1126     History   Chief Complaint Chief Complaint  Patient presents with  . Weakness    HPI Gina Edwards is a 77 y.o. female.  HPI Level 5 caveat for severe dementia 77 year old female with history of dementia comes in with chief complaint of weakness. According to patient's husband, he noticed that patient was already awake early which is not uncommon.  When he tried to bring her back to the bedroom, he noted that patient was profoundly weak and unable to get up the stairs.  It appeared that patient was dead weight and so he called the primary care team, who advised that patient be brought to the emergency room.  There is no history of strokes, no new confusion and no trauma or recent infections.  Patient has had UTI in the past that have presented with pain and fevers.  No history of similar episode in the past.  Patient denies any complaints from her side.  Past Medical History:  Diagnosis Date  . Anxiety 12/02/2012  . Anxiety and depression   . Dementia 12/02/2012  . Depression 12/02/2012  . H/O meningioma of the brain 12/02/2012  . SUI (stress urinary incontinence, female)     Patient Active Problem List   Diagnosis Date Noted  . Dementia 12/02/2012  . Depression 12/02/2012  . Anxiety 12/02/2012  . H/O meningioma of the brain 12/02/2012    Past Surgical History:  Procedure Laterality Date  . APPENDECTOMY  1957  . TUBAL LIGATION Bilateral 1975     OB History    Gravida  1   Para  1   Term  1   Preterm      AB      Living  1     SAB      TAB      Ectopic      Multiple      Live Births               Home Medications    Prior to Admission medications   Medication Sig Start Date End Date Taking? Authorizing Provider  ALPRAZolam Duanne Moron) 1 MG tablet Take 1 tablet by mouth daily. 10/04/12  Yes [provider]    ARIPiprazole (ABILIFY) 5 MG tablet Take 5 mg by mouth daily.  11/29/12  Yes [provider]  cholecalciferol (VITAMIN D) 1000 units tablet Take 2,000 Units by mouth daily.   Yes [provider]  escitalopram (LEXAPRO) 10 MG tablet Take 10 mg by mouth daily.  09/14/12  Yes [provider]  esomeprazole (NEXIUM) 40 MG capsule Take 40 mg by mouth every other day.    Yes [provider]  cephALEXin (KEFLEX) 500 MG capsule Take 1 capsule (500 mg total) by mouth 3 (three) times daily. 01/24/18   Varney Biles, MD  potassium chloride (KLOR-CON) 20 MEQ packet Take 20 mEq by mouth once for 1 dose. 01/24/18 01/24/18  Varney Biles, MD    Family History Family History  Problem Relation Age of Onset  . Heart disease Mother   . Colon cancer Mother 41  . Prostate cancer Father     Social History Social History   Tobacco Use  . Smoking status: Never Smoker  . Smokeless tobacco: Never Used  Substance Use Topics  . Alcohol use: No  . Drug use: No     Allergies  Ciprofloxacin and Morphine and related   Review of Systems Review of Systems  Unable to perform ROS: Dementia     Physical Exam Updated Vital Signs BP 131/62   Pulse 67   Temp 98.2 F (36.8 C)   Resp 19   SpO2 100%   Physical Exam  Constitutional: She appears well-developed.  HENT:  Head: Atraumatic.  Eyes: Pupils are equal, round, and reactive to light. EOM are normal.  Neck: Neck supple.  Cardiovascular: Normal rate.  Pulmonary/Chest: Effort normal.  Abdominal: Soft. There is no tenderness.  Neurological: She is alert.  Cerebellar exam is normal (finger to nose) Sensory exam normal for bilateral upper and lower extremities - and patient is able to discriminate between sharp and dull. Motor exam is 4+/5 Patient ambulated, and she was able to walk with her own power.   Skin: Skin is warm.  Nursing note and vitals reviewed.    ED Treatments / Results  Labs (all labs  ordered are listed, but only abnormal results are displayed) Labs Reviewed  COMPREHENSIVE METABOLIC PANEL - Abnormal; Notable for the following components:      Result Value   Potassium 3.2 (*)    Glucose, Bld 101 (*)    Creatinine, Ser 1.13 (*)    Calcium 8.6 (*)    Total Protein 5.9 (*)    Albumin 3.4 (*)    Total Bilirubin 2.9 (*)    GFR calc non Af Amer 45 (*)    GFR calc Af Amer 53 (*)    All other components within normal limits  URINALYSIS, ROUTINE W REFLEX MICROSCOPIC - Abnormal; Notable for the following components:   APPearance HAZY (*)    Hgb urine dipstick SMALL (*)    Leukocytes, UA LARGE (*)    WBC, UA >50 (*)    All other components within normal limits  I-STAT CHEM 8, ED - Abnormal; Notable for the following components:   Potassium 3.1 (*)    Chloride 98 (*)    Calcium, Ion 1.13 (*)    All other components within normal limits  URINE CULTURE  PROTIME-INR  APTT  CBC  DIFFERENTIAL  MAGNESIUM  I-STAT TROPONIN, ED  CBG MONITORING, ED  I-STAT CG4 LACTIC ACID, ED  I-STAT CG4 LACTIC ACID, ED    EKG EKG Interpretation  Date/Time:  Sunday January 24 2018 11:34:13 EDT Ventricular Rate:  88 PR Interval:  144 QRS Duration: 78 QT Interval:  364 QTC Calculation: 440 R Axis:   83 Text Interpretation:  Normal sinus rhythm Septal infarct , age undetermined ST & T wave abnormality, consider lateral ischemia Abnormal ECG No acute changes Nonspecific ST and T wave abnormality Confirmed by Varney Biles [phone]) on 01/24/2018 12:13:36 PM   Radiology No results found.  Procedures Procedures (including critical care time)  Medications Ordered in ED Medications  lactated ringers bolus 1,000 mL (0 mLs Intravenous Stopped 01/24/18 1451)  cefTRIAXone (ROCEPHIN) 1 g in sodium chloride 0.9 % 100 mL IVPB (0 g Intravenous Stopped 01/24/18 1601)     Initial Impression / Assessment and Plan / ED Course  I have reviewed the triage vital signs and the nursing  notes.  Pertinent labs & imaging results that were available during my care of the patient were reviewed by me and considered in my medical decision making (see chart for details).  Clinical Course as of Jan 25 1656  Sun Jan 24, 2018  1657 UA does appear to be consistent with UTI.  Previous cultures  have been reviewed.  Patient has ambulated with me and then with the nursing staff and the family is comfortable taking her home. They have requested a walker.  And I placed that order.  Urinalysis, Routine w reflex microscopic(!) [AN]    Clinical Course User Index [AN] Varney Biles, MD    77 year old female comes in with chief complaint of weakness. She has history of severe dementia.  No history of strokes, and there is no focal neurologic deficit on our exam.  Allegedly, patient was unable to ambulate at all at her home.  For [place] she is able to ambulate but according to the husband patient is still appearing a little sluggish to him.  Differential diagnosis includes dehydration, electrolyte abnormalities, atypical presentation for an STEMI, infection such as UTI.  Subdural hematoma considered less likely given there is no trauma and no headaches.  No focal neuro deficits, I do not think patient had an acute stroke.  Plan is to get basic labs and reassess.  Final Clinical Impressions(s) / ED Diagnoses   Final diagnoses:  Acute hypokalemia  Generalized weakness  Acute UTI (urinary tract infection)    ED Discharge Orders        Ordered    potassium chloride (KLOR-CON) 20 MEQ packet   Once     01/24/18 1649    cephALEXin (KEFLEX) 500 MG capsule  3 times daily     01/24/18 1649       Varney Biles, MD 01/24/18 1255    Varney Biles, MD 01/24/18 [place], Shaylon Aden, MD 01/24/18 1657

## 2018-01-24 NOTE — ED Notes (Signed)
Pt ambulatory in hallway with staff to and from bathroom with minimal assistance. Husband states he does feel comfortable taking the pt home. MD aware.

## 2018-01-25 ENCOUNTER — Telehealth: Payer: Self-pay | Admitting: *Deleted

## 2018-01-25 LAB — URINE CULTURE: Culture: <10000 — AB

## 2018-01-25 NOTE — Telephone Encounter (Signed)
EDCM placed follow-up call to inquire about DME pick-up/delivery.  Pt spouse states walker is not needed as pt needs hepl getting up the stairs.  EDCM has recommended that this patient have [facility] but pt spouse declines at this time. I have discussed the benefits of home health services as wll as the risks of not having home health services with pt. [facility] informed patient that if she changed her mind, her primary care physician can order home health from office.The patient verbalizes understanding. No further EDCM needs identified at this time.

## 2018-02-06 ENCOUNTER — Other Ambulatory Visit: Payer: Self-pay

## 2018-02-06 ENCOUNTER — Emergency Department (HOSPITAL_COMMUNITY): Payer: Medicare Other

## 2018-02-06 ENCOUNTER — Emergency Department (HOSPITAL_COMMUNITY)
Admission: EM | Admit: 2018-02-06 | Discharge: 2018-02-06 | Disposition: A | Discharge status: Home / Self Care | Payer: Medicare Other | Attending: Emergency Medicine | Admitting: Emergency Medicine

## 2018-02-06 ENCOUNTER — Encounter (HOSPITAL_COMMUNITY): Payer: Self-pay | Admitting: Emergency Medicine

## 2018-02-06 DIAGNOSIS — N201 Calculus of ureter: Secondary | ICD-10-CM | POA: Diagnosis not present

## 2018-02-06 DIAGNOSIS — I700 Atherosclerosis of aorta: Secondary | ICD-10-CM | POA: Diagnosis not present

## 2018-02-06 DIAGNOSIS — F0390 Unspecified dementia without behavioral disturbance: Secondary | ICD-10-CM | POA: Diagnosis not present

## 2018-02-06 DIAGNOSIS — N130 Hydronephrosis with ureteropelvic junction obstruction: Secondary | ICD-10-CM | POA: Insufficient documentation

## 2018-02-06 DIAGNOSIS — E876 Hypokalemia: Secondary | ICD-10-CM

## 2018-02-06 DIAGNOSIS — Z79899 Other long term (current) drug therapy: Secondary | ICD-10-CM | POA: Insufficient documentation

## 2018-02-06 DIAGNOSIS — R109 Unspecified abdominal pain: Secondary | ICD-10-CM | POA: Diagnosis present

## 2018-02-06 LAB — CBC
HEMATOCRIT: 36.7 % (ref 36.0–46.0)
HEMOGLOBIN: 12.2 g/dL (ref 12.0–15.0)
MCH: 31.3 pg (ref 26.0–34.0)
MCHC: 33.2 g/dL (ref 30.0–36.0)
MCV: 94.1 fL (ref 78.0–100.0)
PLATELETS: 248 10*3/uL (ref 150–400)
RBC: 3.9 MIL/uL (ref 3.87–5.11)
RDW: 14 % (ref 11.5–15.5)
WBC: 6.1 10*3/uL (ref 4.0–10.5)

## 2018-02-06 LAB — COMPREHENSIVE METABOLIC PANEL
ALT: 16 U/L (ref 0–44)
ANION GAP: 8 (ref 5–15)
AST: 18 U/L (ref 15–41)
Albumin: 3.2 g/dL — ABNORMAL LOW (ref 3.5–5.0)
Alkaline Phosphatase: 57 U/L (ref 38–126)
BUN: 12 mg/dL (ref 8–23)
CHLORIDE: 107 mmol/L (ref 98–111)
CO2: 28 mmol/L (ref 22–32)
CREATININE: 0.93 mg/dL (ref 0.44–1.00)
Calcium: 8.6 mg/dL — ABNORMAL LOW (ref 8.9–10.3)
GFR calc Af Amer: >60 mL/min (ref >60)
GFR, EST NON AFRICAN AMERICAN: 57 mL/min — AB (ref >60)
Glucose, Bld: 99 mg/dL (ref 70–99)
POTASSIUM: 2.8 mmol/L — AB (ref 3.5–5.1)
SODIUM: 143 mmol/L (ref 135–145)
Total Bilirubin: 1.5 mg/dL — ABNORMAL HIGH (ref 0.3–1.2)
Total Protein: 5.7 g/dL — ABNORMAL LOW (ref 6.5–8.1)

## 2018-02-06 LAB — URINALYSIS, ROUTINE W REFLEX MICROSCOPIC
BILIRUBIN URINE: NEGATIVE
Bacteria, UA: NONE SEEN
Glucose, UA: NEGATIVE mg/dL
Ketones, ur: NEGATIVE mg/dL
LEUKOCYTES UA: NEGATIVE
Nitrite: NEGATIVE
PROTEIN: NEGATIVE mg/dL
Specific Gravity, Urine: 1.008 (ref 1.005–1.030)
pH: 6 (ref 5.0–8.0)

## 2018-02-06 LAB — MAGNESIUM: MAGNESIUM: 1.9 mg/dL (ref 1.7–2.4)

## 2018-02-06 LAB — LIPASE, BLOOD: Lipase: 30 U/L (ref 11–51)

## 2018-02-06 MED ORDER — POTASSIUM CHLORIDE 20 MEQ PO PACK: 20.0000 meq | PACK | Freq: Once | ORAL | 0 refills | Status: DC

## 2018-02-06 MED ORDER — ONDANSETRON HCL 4 MG PO TABS: 4.0000 mg | ORAL_TABLET | Freq: Three times a day (TID) | ORAL | 0 refills | Status: DC | PRN

## 2018-02-06 MED ORDER — HYDROCODONE-ACETAMINOPHEN 5-325 MG PO TABS: 1.0000 | ORAL_TABLET | Freq: Four times a day (QID) | ORAL | 0 refills | Status: DC | PRN

## 2018-02-06 MED ORDER — POTASSIUM CHLORIDE 20 MEQ PO PACK
40.0000 meq | PACK | Freq: Once | ORAL | Status: AC
  Administered 2018-02-06: 40 meq via ORAL
  Filled 2018-02-06: qty 2

## 2018-02-06 MED ORDER — POTASSIUM CHLORIDE CRYS ER 20 MEQ PO TBCR
40.0000 meq | EXTENDED_RELEASE_TABLET | Freq: Once | ORAL | Status: DC
  Filled 2018-02-06: qty 2

## 2018-02-06 NOTE — Discharge Instructions (Addendum)
Please read and follow all provided instructions.  You have been diagnosed with kidney stones.    Tests performed today include: Urine test that showed blood in your urine and no infection. A urine culture has been sent.  CT scan which showed a 6 x 5 mm right UVJ calculus  Blood test that showed normal kidney function - put also showed hypokalemia (low potassium)  Vital signs. See below for your results today.   Please take potassium as directed.  You are being provided a prescription for opiates (also known as narcotics) for pain control.  Opiates can be addictive and should only be used when absolutely necessary for pain control when other alternatives do not work.  We recommend you only use them for the recommended amount of time and only as prescribed.  Please do not take with other sedative medications or alcohol.  Please do not drive, operate machinery, or make important decisions while taking opiates.  Please note that these medications can be addictive and have high abuse potential.  Please keep these medications locked away from children, teenagers or any family members with history of substance abuse.  Note that your pain medication contains Acetaminophen (Tylenol), therefore it is not recommended to take additional Tylenol while on your pain medication (please read labels!). Continue to drink fluids to help you pass the stones. I recommend that you double you fluid intake for the next several days. Strain your urine and save any stones that may pass.  Use Zofran for nausea as directed.   Follow-up with [facility] this week.  Please call Monday to schedule follow-up appointment in regards to your hospital visit.    Return to the ED immediately if you develop fever that persists > 101, uncontrolled pain or vomiting, or other concerns.    Do not take your medicine if develop an itchy rash, swelling in your mouth or lips, or difficulty breathing.   Read the instructions below to learn  more about kidney stones.  If you do not have a primary care doctor to follow-up with, use the resource guide attached to help you find one.  What are Kidney Stones? Kidney stones (ureteral lithiasis) are solid masses that form inside your kidneys. The intense pain is caused by the stone moving through the kidney, ureter, bladder, and urethra (urinary tract). When the stone moves, the ureter starts to spasm around the stone. The stone is usually passed in the urine. This can take time to occur.  HOME CARE Drink enough fluids to keep your pee (urine) clear or pale yellow. This helps to get the stone out.  Only take medicine as told by your doctor.  Follow up with your doctor as told.  GET HELP RIGHT AWAY IF:  Your pain does not get better with medicine. Or your pain increases and gets worse over 18 hours.  You have a fever (>101).  You have new belly (abdominal) pain.  You have uncontrolled vomiting You feel faint or pass out.  Additional Information:  Your vital signs today were: BP (!) 155/79    Pulse 76    Temp 97.8 F (36.6 C) (Oral)    Resp 12    Ht 5\' 6"  (1.676 m)    Wt 64.4 kg (142 lb)    SpO2 99%    BMI 22.92 kg/m  If your blood pressure (BP) was elevated above 135/85 this visit, please have this repeated by your doctor within one month. ---------------

## 2018-02-06 NOTE — ED Notes (Signed)
Patient transported to CT 

## 2018-02-06 NOTE — ED Provider Notes (Signed)
[facility] DEPT Provider Note   CSN: [id number] Arrival date & time: 02/06/18  0421     History   Chief Complaint Chief Complaint  Patient presents with  . Abdominal Pain    HPI - Level 5 caveat secondary to dementia  Gina Edwards is a 77 y.o. female with a history of dementia, anxiety, stress incontinence and a prior appendectomy who presents emergency department today for abdominal pain.  Husband is at bedside he was at home with patient and helps provide history.  He reports when the patient woke at 3 AM this morning to use the restroom she complained of suprapubic abdominal pain.  He reports that the pain has now subsided.  She did not describe the pain.  It lasted for approximately 1-2 hours.  States this is similar to when she has had UTIs in the past however she has not had any increased urinary frequency, complaint of dysuria and he has not noticed hematuria.  Patient does wear depends.  No reported fever, emesis, diarrhea, trauma or falls.  Husband states she is at her baseline.  Denies history of kidney stones.  Patient is on stool softeners and have approximately 1-2 bowel movements that are normal per day.  Last bowel movement has occurred since onset of pain.  HPI  Past Medical History:  Diagnosis Date  . Anxiety 12/02/2012  . Anxiety and depression   . Dementia 12/02/2012  . Depression 12/02/2012  . H/O meningioma of the brain 12/02/2012  . SUI (stress urinary incontinence, female)     Patient Active Problem List   Diagnosis Date Noted  . Dementia 12/02/2012  . Depression 12/02/2012  . Anxiety 12/02/2012  . H/O meningioma of the brain 12/02/2012    Past Surgical History:  Procedure Laterality Date  . APPENDECTOMY  1957  . TUBAL LIGATION Bilateral 1975     OB History    Gravida  1   Para  1   Term  1   Preterm      AB      Living  1     SAB      TAB      Ectopic      Multiple      Live Births                Home Medications    Prior to Admission medications   Medication Sig Start Date End Date Taking? Authorizing Provider  ALPRAZolam Duanne Moron) 1 MG tablet Take 1 tablet by mouth daily. 10/04/12   [provider]  ARIPiprazole (ABILIFY) 5 MG tablet Take 5 mg by mouth daily.  11/29/12   [provider]  cephALEXin (KEFLEX) 500 MG capsule Take 1 capsule (500 mg total) by mouth 3 (three) times daily. 01/24/18   Varney Biles, MD  cholecalciferol (VITAMIN D) 1000 units tablet Take 2,000 Units by mouth daily.    [provider]  escitalopram (LEXAPRO) 10 MG tablet Take 10 mg by mouth daily.  09/14/12   [provider]  esomeprazole (NEXIUM) 40 MG capsule Take 40 mg by mouth every other day.     [provider]  potassium chloride (KLOR-CON) 20 MEQ packet Take 20 mEq by mouth once for 1 dose. 01/24/18 01/24/18  Varney Biles, MD    Family History Family History  Problem Relation Age of Onset  . Heart disease Mother   . Colon cancer Mother 61  . Prostate cancer Father  Social History Social History   Tobacco Use  . Smoking status: Never Smoker  . Smokeless tobacco: Never Used  Substance Use Topics  . Alcohol use: No  . Drug use: No     Allergies   Ciprofloxacin and Morphine and related   Review of Systems Review of Systems  Unable to perform ROS: Dementia     Physical Exam Updated Vital Signs BP 139/72 (BP Location: Left Arm)   Pulse 71   Temp 97.8 F (36.6 C) (Oral)   Resp 16   Ht 5\' 6"  (1.676 m)   Wt 64.4 kg (142 lb)   SpO2 94%   BMI 22.92 kg/m   Physical Exam  Constitutional: She appears well-developed and well-nourished.  HENT:  Head: Normocephalic and atraumatic.  Right Ear: External ear normal.  Left Ear: External ear normal.  Nose: Nose normal.  Mouth/Throat: Uvula is midline, oropharynx is clear and moist and mucous membranes are normal. No tonsillar exudate.  Eyes: Pupils are equal, round, and reactive to  light. Right eye exhibits no discharge. Left eye exhibits no discharge. No scleral icterus.  Neck: Trachea normal. Neck supple. No spinous process tenderness present. No neck rigidity. Normal range of motion present.  Cardiovascular: Normal rate, regular rhythm and intact distal pulses.  No murmur heard. Pulses:      Radial pulses are 2+ on the right side, and 2+ on the left side.       Dorsalis pedis pulses are 2+ on the right side, and 2+ on the left side.       Posterior tibial pulses are 2+ on the right side, and 2+ on the left side.  No lower extremity swelling or edema. Calves symmetric in size bilaterally.  Pulmonary/Chest: Effort normal and breath sounds normal. She exhibits no tenderness.  Abdominal: Soft. Bowel sounds are normal. She exhibits no distension. There is no tenderness. There is no rigidity, no rebound, no guarding and no CVA tenderness.  Musculoskeletal: She exhibits no edema.  Lymphadenopathy:    She has no cervical adenopathy.  Neurological: She is alert.  Speech clear. Follows commands. No facial droop. PERRLA. EOM grossly intact. CN III-XII grossly intact. Grossly moves all extremities 4 without ataxia. Able and appropriate strength for age to upper and lower extremities bilaterally.   Skin: Skin is warm and dry. No rash noted. Rash is not vesicular. She is not diaphoretic.  Psychiatric: She has a normal mood and affect.  Nursing note and vitals reviewed.    ED Treatments / Results  Labs (all labs ordered are listed, but only abnormal results are displayed) Labs Reviewed  COMPREHENSIVE METABOLIC PANEL - Abnormal; Notable for the following components:      Result Value   Potassium 2.8 (*)    Calcium 8.6 (*)    Total Protein 5.7 (*)    Albumin 3.2 (*)    Total Bilirubin 1.5 (*)    GFR calc non Af Amer 57 (*)    All other components within normal limits  URINALYSIS, ROUTINE W REFLEX MICROSCOPIC - Abnormal; Notable for the following components:   Hgb urine  dipstick MODERATE (*)    All other components within normal limits  URINE CULTURE  LIPASE, BLOOD  CBC  MAGNESIUM    EKG EKG Interpretation  Date/Time:  Saturday February 06 2018 08:23:52 EDT Ventricular Rate:  67 PR Interval:    QRS Duration: 98 QT Interval:  478 QTC Calculation: 505 R Axis:   92 Text Interpretation:  Sinus rhythm  Right axis deviation Borderline T abnormalities, anterior leads Prolonged QT interval Baseline wander in lead(s) V1 Since last tracing QT has lengthened Confirmed by Daleen Bo [phone]) on 02/06/2018 8:29:53 AM   Radiology Ct Renal Stone Study  Result Date: 02/06/2018 CLINICAL DATA:  Hematuria with unknown cause EXAM: CT ABDOMEN AND PELVIS WITHOUT CONTRAST TECHNIQUE: Multidetector CT imaging of the abdomen and pelvis was performed following the standard protocol without IV contrast. COMPARISON:  None. FINDINGS: Lower chest:  Mitral annular calcification.  No acute finding. Hepatobiliary: No focal liver abnormality.No evidence of biliary obstruction or stone. Pancreas: Generalized atrophy Spleen: Unremarkable. Adrenals/Urinary Tract: Negative adrenals. Moderate right hydroureteronephrosis secondary to a 6 x 5 mm stone at the UVJ. Small left renal cystic density. Gas in the urinary bladder, please correlate with urinalysis and sampling method. Stomach/Bowel: No obstruction. Prominent colonic Vasa recta but no convincing wall thickening (when accounting for under distended segments) and no history of diarrhea or inflammatory bowel disease. Vascular/Lymphatic: Negative small bowel. Atherosclerotic calcification Reproductive:Unremarkable for age Other: No ascites or pneumoperitoneum. Musculoskeletal: Diffuse degenerative changes in the spine IMPRESSION: 1. Obstructing 6 x 5 mm right UVJ calculus. 2. Gas in the urinary bladder, please correlate with urinalysis. 3.  Aortic Atherosclerosis (ICD10-I70.0). Electronically Signed   By: Monte Fantasia M.D.   On: 02/06/2018 08:05      Procedures Procedures (including critical care time)  Medications Ordered in ED Medications  potassium chloride (KLOR-CON) packet 40 mEq (40 mEq Oral Given 02/06/18 0910)     Initial Impression / Assessment and Plan / ED Course  I have reviewed the triage vital signs and the nursing notes.  Pertinent labs & imaging results that were available during my care of the patient were reviewed by me and considered in my medical decision making (see chart for details).  Clinical Course as of Feb 06 999  Sat Feb 07, 4435  5449 77 year old female with history of prior UTIs here with low abdominal pain acute onset around 5 AM.  Pain is since resolved since she got here.  Husband brought her in because she is had frequent urine infections want to make sure it was same.  Does not appear to be any other symptoms.  Abdomen is soft nontender no masses guarding or rebound.  Checking some screening labs urinalysis.  Likely discharge if no acute medical condition identified.   [MB]    Clinical Course User Index [MB] Hayden Rasmussen, MD    This is a pleasantly demented 77 year old female presenting with suprapubic abdominal pain that began this morning and is now resolved.  Patient brought her as she notes that she has frequent UTIs.  She denies any other associated symptoms at this time.  Vital signs are reassuring.  She is without fever, tachycardia, tachypnea, hypoxia or hypotension.  Abdomen is soft and nontender without any guarding or rigidity.  Urinalysis without evidence of UTI.  There is hematuria.  She denies history of kidney stones will check a CT renal stone study to evaluate.  Lab work without any leukocytosis.  No anemia.  Sodium within normal limits.  No anion gap acidosis.  Lipase within normal limits.  Patient is noted to have a potassium of 2.8.  Patient does have history of low potassium.  Will supplement orally.  Will obtain EKG and magnesium.  EKG with lengthening QT.  Will avoid  Zofran in the department.  Potassium supplementation given.  Magnesium is within normal limits.  CT scan shows an obstructing 6 x  5 mm right UVJ stone with moderate hydroureteronephrosis.  Also noted to be gas in the urinary bladder however urinalysis is without evidence of UTI.  Will send culture.  Patient's pain is currently controlled.  She is tolerating p.o. fluids.  She does not have a urologist.  [facility] and have patient (patient's husband) Monday morning.  Will prescribe short course of pain medication as needed for breakthrough pain.  Husband believes she has tolerated Norco in the past.  Will send home with prescription of potassium as well.  I advised the patient to follow-up with Urology this week. Specific return precautions discussed. Time was given for all questions to be answered. The patient verbalized understanding and agreement with plan. The patient appears safe for discharge home.  Patient case seen and discussed with Dr. Melina Copa who is in agreement with plan.   Final Clinical Impressions(s) / ED Diagnoses   Final diagnoses:  Ureterolithiasis  Hypokalemia    ED Discharge Orders    None       Lorelle Gibbs 02/06/18 1006    Hayden Rasmussen, MD 02/06/18 [id number]

## 2018-02-06 NOTE — ED Notes (Signed)
Requested patient to urinate. Patient tried with no success.

## 2018-02-07 LAB — URINE CULTURE: Culture: <10000 — AB

## 2018-02-09 DIAGNOSIS — N201 Calculus of ureter: Secondary | ICD-10-CM | POA: Diagnosis not present

## 2018-03-05 DIAGNOSIS — N201 Calculus of ureter: Secondary | ICD-10-CM | POA: Diagnosis not present

## 2018-05-12 ENCOUNTER — Emergency Department (HOSPITAL_COMMUNITY): Payer: Medicare Other

## 2018-05-12 ENCOUNTER — Inpatient Hospital Stay (HOSPITAL_COMMUNITY)
Admission: EM | Admit: 2018-05-12 | Discharge: 2018-06-18 | DRG: 884 | Disposition: A | Discharge status: Skilled Nursing Facility | Payer: Medicare Other | Attending: Internal Medicine | Admitting: Internal Medicine

## 2018-05-12 ENCOUNTER — Encounter (HOSPITAL_COMMUNITY): Payer: Self-pay | Admitting: Emergency Medicine

## 2018-05-12 DIAGNOSIS — R456 Violent behavior: Secondary | ICD-10-CM | POA: Diagnosis present

## 2018-05-12 DIAGNOSIS — B961 Klebsiella pneumoniae [K. pneumoniae] as the cause of diseases classified elsewhere: Secondary | ICD-10-CM | POA: Diagnosis present

## 2018-05-12 DIAGNOSIS — N390 Urinary tract infection, site not specified: Secondary | ICD-10-CM | POA: Diagnosis present

## 2018-05-12 DIAGNOSIS — F419 Anxiety disorder, unspecified: Secondary | ICD-10-CM | POA: Diagnosis not present

## 2018-05-12 DIAGNOSIS — F05 Delirium due to known physiological condition: Secondary | ICD-10-CM | POA: Diagnosis present

## 2018-05-12 DIAGNOSIS — Z881 Allergy status to other antibiotic agents status: Secondary | ICD-10-CM

## 2018-05-12 DIAGNOSIS — Z008 Encounter for other general examination: Secondary | ICD-10-CM

## 2018-05-12 DIAGNOSIS — R4020 Unspecified coma: Secondary | ICD-10-CM | POA: Diagnosis not present

## 2018-05-12 DIAGNOSIS — R4689 Other symptoms and signs involving appearance and behavior: Secondary | ICD-10-CM

## 2018-05-12 DIAGNOSIS — F0391 Unspecified dementia with behavioral disturbance: Secondary | ICD-10-CM | POA: Diagnosis not present

## 2018-05-12 DIAGNOSIS — F0390 Unspecified dementia without behavioral disturbance: Secondary | ICD-10-CM | POA: Diagnosis not present

## 2018-05-12 DIAGNOSIS — F23 Brief psychotic disorder: Secondary | ICD-10-CM | POA: Diagnosis present

## 2018-05-12 DIAGNOSIS — J329 Chronic sinusitis, unspecified: Secondary | ICD-10-CM | POA: Diagnosis present

## 2018-05-12 DIAGNOSIS — Z8249 Family history of ischemic heart disease and other diseases of the circulatory system: Secondary | ICD-10-CM

## 2018-05-12 DIAGNOSIS — Z66 Do not resuscitate: Secondary | ICD-10-CM | POA: Diagnosis present

## 2018-05-12 DIAGNOSIS — Z79899 Other long term (current) drug therapy: Secondary | ICD-10-CM

## 2018-05-12 DIAGNOSIS — J324 Chronic pansinusitis: Secondary | ICD-10-CM | POA: Diagnosis present

## 2018-05-12 DIAGNOSIS — Z885 Allergy status to narcotic agent status: Secondary | ICD-10-CM

## 2018-05-12 DIAGNOSIS — F03918 Unspecified dementia, unspecified severity, with other behavioral disturbance: Secondary | ICD-10-CM | POA: Diagnosis present

## 2018-05-12 DIAGNOSIS — F319 Bipolar disorder, unspecified: Secondary | ICD-10-CM | POA: Diagnosis present

## 2018-05-12 DIAGNOSIS — N393 Stress incontinence (female) (male): Secondary | ICD-10-CM | POA: Diagnosis present

## 2018-05-12 DIAGNOSIS — Z781 Physical restraint status: Secondary | ICD-10-CM

## 2018-05-12 DIAGNOSIS — D320 Benign neoplasm of cerebral meninges: Secondary | ICD-10-CM | POA: Diagnosis present

## 2018-05-12 LAB — COMPREHENSIVE METABOLIC PANEL
ALBUMIN: 3.8 g/dL (ref 3.5–5.0)
ALK PHOS: 89 U/L (ref 38–126)
ALT: 14 U/L (ref 0–44)
ANION GAP: 7 (ref 5–15)
AST: 15 U/L (ref 15–41)
BILIRUBIN TOTAL: 1.9 mg/dL — AB (ref 0.3–1.2)
BUN: 14 mg/dL (ref 8–23)
CALCIUM: 9.1 mg/dL (ref 8.9–10.3)
CO2: 28 mmol/L (ref 22–32)
Chloride: 106 mmol/L (ref 98–111)
Creatinine, Ser: 0.9 mg/dL (ref 0.44–1.00)
GFR calc Af Amer: >60 mL/min (ref >60)
GFR, EST NON AFRICAN AMERICAN: 60 mL/min — AB (ref >60)
GLUCOSE: 105 mg/dL — AB (ref 70–99)
Potassium: 3.9 mmol/L (ref 3.5–5.1)
Sodium: 141 mmol/L (ref 135–145)
Total Protein: 6.7 g/dL (ref 6.5–8.1)

## 2018-05-12 LAB — URINALYSIS, ROUTINE W REFLEX MICROSCOPIC
BILIRUBIN URINE: NEGATIVE
GLUCOSE, UA: NEGATIVE mg/dL
KETONES UR: NEGATIVE mg/dL
NITRITE: NEGATIVE
PH: 7 (ref 5.0–8.0)
Protein, ur: NEGATIVE mg/dL
SPECIFIC GRAVITY, URINE: 1.003 — AB (ref 1.005–1.030)

## 2018-05-12 LAB — CBC WITH DIFFERENTIAL/PLATELET
BASOS PCT: 1 %
Basophils Absolute: 0 10*3/uL (ref 0.0–0.1)
EOS ABS: 0.1 10*3/uL (ref 0.0–0.7)
EOS PCT: 2 %
HCT: 41.8 % (ref 36.0–46.0)
Hemoglobin: 13.6 g/dL (ref 12.0–15.0)
Lymphocytes Relative: 33 %
Lymphs Abs: 2 10*3/uL (ref 0.7–4.0)
MCH: 30.4 pg (ref 26.0–34.0)
MCHC: 32.5 g/dL (ref 30.0–36.0)
MCV: 93.5 fL (ref 78.0–100.0)
MONOS PCT: 8 %
Monocytes Absolute: 0.5 10*3/uL (ref 0.1–1.0)
Neutro Abs: 3.4 10*3/uL (ref 1.7–7.7)
Neutrophils Relative %: 56 %
PLATELETS: 224 10*3/uL (ref 150–400)
RBC: 4.47 MIL/uL (ref 3.87–5.11)
RDW: 14.1 % (ref 11.5–15.5)
WBC: 6 10*3/uL (ref 4.0–10.5)

## 2018-05-12 LAB — RAPID URINE DRUG SCREEN, HOSP PERFORMED
Amphetamines: NOT DETECTED
BARBITURATES: NOT DETECTED
Benzodiazepines: NOT DETECTED
COCAINE: NOT DETECTED
Opiates: NOT DETECTED
Tetrahydrocannabinol: NOT DETECTED

## 2018-05-12 LAB — ETHANOL

## 2018-05-12 MED ORDER — STERILE WATER FOR INJECTION IJ SOLN
INTRAMUSCULAR | Status: AC
  Administered 2018-05-12: 1.2 mL
  Filled 2018-05-12: qty 10

## 2018-05-12 MED ORDER — ZIPRASIDONE MESYLATE 20 MG IM SOLR
10.0000 mg | Freq: Once | INTRAMUSCULAR | Status: AC
  Administered 2018-05-12: 10 mg via INTRAMUSCULAR
  Filled 2018-05-12: qty 20

## 2018-05-12 MED ORDER — OLANZAPINE 10 MG IM SOLR
5.0000 mg | Freq: Once | INTRAMUSCULAR | Status: AC | PRN
  Administered 2018-05-12: 5 mg via INTRAMUSCULAR
  Filled 2018-05-12: qty 10

## 2018-05-12 MED ORDER — LORAZEPAM 1 MG PO TABS
1.0000 mg | ORAL_TABLET | ORAL | Status: AC | PRN
  Administered 2018-05-13: 1 mg via ORAL
  Filled 2018-05-12: qty 1

## 2018-05-12 MED ORDER — STERILE WATER FOR INJECTION IJ SOLN
INTRAMUSCULAR | Status: AC
  Filled 2018-05-12: qty 10

## 2018-05-12 MED ORDER — CEPHALEXIN 500 MG PO CAPS
500.0000 mg | ORAL_CAPSULE | Freq: Two times a day (BID) | ORAL | Status: DC
  Administered 2018-05-13 – 2018-05-14 (×3): 500 mg via ORAL
  Filled 2018-05-12 (×5): qty 1

## 2018-05-12 MED ORDER — SODIUM CHLORIDE 0.9 % IV BOLUS: 500.0000 mL | Freq: Once | INTRAVENOUS | Status: DC

## 2018-05-12 MED ORDER — QUETIAPINE FUMARATE 25 MG PO TABS
25.0000 mg | ORAL_TABLET | Freq: Every day | ORAL | Status: DC
  Administered 2018-05-13 – 2018-05-17 (×5): 25 mg via ORAL
  Filled 2018-05-12 (×6): qty 1

## 2018-05-12 MED ORDER — OLANZAPINE 5 MG PO TBDP
5.0000 mg | ORAL_TABLET | Freq: Once | ORAL | Status: AC
  Administered 2018-05-12: 5 mg via ORAL
  Filled 2018-05-12: qty 1

## 2018-05-12 MED ORDER — LORAZEPAM 2 MG/ML IJ SOLN
1.0000 mg | Freq: Once | INTRAMUSCULAR | Status: AC
  Administered 2018-05-12: 1 mg via INTRAMUSCULAR
  Filled 2018-05-12: qty 1

## 2018-05-12 MED ORDER — CLONAZEPAM 0.5 MG PO TABS
0.5000 mg | ORAL_TABLET | Freq: Every day | ORAL | Status: DC
  Administered 2018-05-12 – 2018-06-11 (×30): 0.5 mg via ORAL
  Filled 2018-05-12 (×31): qty 1
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
## 2018-05-12 NOTE — ED Notes (Signed)
Seizure pads placed on bed rails to prevent harm to pt d/t restlessness.
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
## 2018-05-12 NOTE — ED Notes (Signed)
Dr.Jacubowitz informed of pt's increased agitation.

## 2018-05-12 NOTE — ED Notes (Signed)
Pts husband Calianne Larue [phone] or [phone]

## 2018-05-12 NOTE — Progress Notes (Addendum)
Patient ID: Gina Edwards, female   DOB: 13-Dec-1940, 77 y.o.   MRN: [id number]  Pt's chart reviewed by treatment team. Pt will be recommended Pt has been on 1 mg Xanax daily for a very long time. Pt is a fall risk and has dementia. This writer has replaced Xanax with 0.5 mg Klonopin daily in an effort to taper down her benzodiazepines. This Probation officer also added Seroquel 25 mg QHS for mood stabilization.   Ethelene Hal, NP-C 05-12-2018       [phone]   Patient's chart reviewed and case discussed with the physician extender and developed treatment plan. Reviewed the information documented and agree with the treatment plan.  Buford Dresser, DO 05/13/18 10:57 PM

## 2018-05-12 NOTE — ED Notes (Signed)
Dr. Winfred Leeds informed of pt's decreased agitation and trial release of restraints.

## 2018-05-12 NOTE — ED Notes (Signed)
Pt refused to go to CT and also did not go to X-ray because she had not change clothes yet.  Pt was very difficult to change out.  Pt is very agitated .  She continues to try to elope.

## 2018-05-12 NOTE — ED Notes (Signed)
Pt with incontinent urine episode.  Pt cleaned & bed linens changed.  Pt kicking, hitting and attempting to get OOB.

## 2018-05-12 NOTE — ED Notes (Signed)
Bed: [facility] Expected date:  Expected time:  Means of arrival:  Comments: 

## 2018-05-12 NOTE — ED Notes (Signed)
Patient  transported to CT via hospital bed.  

## 2018-05-12 NOTE — ED Triage Notes (Signed)
Family states that patient's dementia has progressed with aggressive behavior the past 2 weeks. Reports that they called [place] [facility] and was told to come here.
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
## 2018-05-13 DIAGNOSIS — F0391 Unspecified dementia with behavioral disturbance: Principal | ICD-10-CM

## 2018-05-13 DIAGNOSIS — R4020 Unspecified coma: Secondary | ICD-10-CM | POA: Diagnosis not present

## 2018-05-13 MED ORDER — OLANZAPINE 10 MG IM SOLR
5.0000 mg | Freq: Once | INTRAMUSCULAR | Status: AC | PRN
  Administered 2018-05-13: 5 mg via INTRAMUSCULAR
  Filled 2018-05-13: qty 10

## 2018-05-13 MED ORDER — ESCITALOPRAM OXALATE 10 MG PO TABS
10.0000 mg | ORAL_TABLET | Freq: Every day | ORAL | Status: DC
  Administered 2018-05-13 – 2018-06-18 (×36): 10 mg via ORAL
  Filled 2018-05-13 (×36): qty 1

## 2018-05-13 MED ORDER — STERILE WATER FOR INJECTION IJ SOLN
INTRAMUSCULAR | Status: AC
  Filled 2018-05-13: qty 10

## 2018-05-13 MED ORDER — ARIPIPRAZOLE 10 MG PO TABS
5.0000 mg | ORAL_TABLET | Freq: Every day | ORAL | Status: DC
  Administered 2018-05-13 – 2018-05-18 (×6): 5 mg via ORAL
  Filled 2018-05-13 (×6): qty 1

## 2018-05-13 MED ORDER — VITAMIN D3 25 MCG (1000 UNIT) PO TABS
2000.0000 [IU] | ORAL_TABLET | Freq: Every day | ORAL | Status: DC
  Administered 2018-05-13 – 2018-06-18 (×35): 2000 [IU] via ORAL
  Filled 2018-05-13 (×35): qty 2
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
## 2018-05-13 NOTE — ED Notes (Signed)
Patient asleep but wakes to verbal stimuli. Patient readjusted in bed by this Probation officer and tech. Patient took medications crushed and in applesauce without complaint. Patient unable to drink from a straw, by this writer's perception, but patient can drink straight from a cup. Patient offered lunch tray, but patient refused. Patient remains confused but calm. Sitter at bedside.

## 2018-05-13 NOTE — BH Assessment (Signed)
[facility] Assessment Progress Note  Per Buford Dresser, DO, this pt requires psychiatric hospitalization.  Pt presents under IVC initiated by EDP Dorie Rank, MD.  At 15:00 this writer spoke to [facility] at the [facility] and obtained authorization for [facility] referral, authorization #[id number] from 05/13/2018 - 05/20/2018.  Please note that authorization does not mean that pt has been accepted to the facility.  At 15:07 I called [facility] and spoke to EJ, who accepted demographic information by telephone.  Referral information was then faxed to [facility].  At 15:58 Iva Boop at [facility] reports that they are currently receiving transmission on their fax machine.  At 16:27 Kim at [facility] confirms final receipt.  As of this writing a final decision is pending.  Additionally, the following facilities have been contacted to seek placement for this pt, with results as noted:  Beds available, information sent, decision pending:  [place]:  [facility] (does not have a dementia unit)   At capacity:  Virgel Bouquet [place], [place] Coordinator [place], [place] Triage Specialist [phone]

## 2018-05-13 NOTE — Progress Notes (Addendum)
CSW aware of consult. Patient currently being recommended for a geropsych bed. CSW to follow up with patient's husband regarding resources for long-term memory care ALF options for future use. CSW will continue to follow.  2:32pm- CSW called and spoke with husband, Gina Edwards [phone], regarding patient disposition. CSW inquired about if patient had a legal guardian. Per [name], he is not the legal guardian but is POA. [name] stated he would fax over documents when he got back home.  Ollen Barges, [place] Work Department  [employer]  [phone]

## 2018-05-13 NOTE — ED Notes (Signed)
Patient resting quietly. Denies needs at this time.

## 2018-05-13 NOTE — ED Notes (Signed)
Patient began to wake up very agitated. Patient began pulling out external catheter, taking off her brief and exposing herself,  and attempting to roll out of bed. The patient could not be verbally redirected. EDMD notified of patient's increasing agitated behavior. IM Zyprexa ordered and administered.
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
## 2018-05-13 NOTE — ED Notes (Signed)
Pt has been sleeping since shift change.  Pt's breathing is regular with some congestion.  Pt has had mittens on and is in bed.  Unable to perform assessment or provide meds until patient is awake do to aggressive nature of patient.

## 2018-05-13 NOTE — ED Notes (Signed)
Patient asleep at this time. Patient wakes to verbal stimuli, but then falls back asleep. VSS.

## 2018-05-13 NOTE — ED Notes (Signed)
Meds given in applesauce; pt refused the drink.

## 2018-05-14 DIAGNOSIS — R4689 Other symptoms and signs involving appearance and behavior: Secondary | ICD-10-CM

## 2018-05-14 DIAGNOSIS — F0391 Unspecified dementia with behavioral disturbance: Secondary | ICD-10-CM | POA: Diagnosis not present

## 2018-05-14 LAB — URINALYSIS, ROUTINE W REFLEX MICROSCOPIC
Bilirubin Urine: NEGATIVE
GLUCOSE, UA: NEGATIVE mg/dL
Ketones, ur: 20 mg/dL — AB
Nitrite: NEGATIVE
PH: 5 (ref 5.0–8.0)
Protein, ur: 100 mg/dL — AB
SPECIFIC GRAVITY, URINE: 1.017 (ref 1.005–1.030)
WBC, UA: >50 WBC/hpf — ABNORMAL HIGH (ref 0–5)

## 2018-05-14 MED ORDER — OLANZAPINE 10 MG IM SOLR
5.0000 mg | Freq: Once | INTRAMUSCULAR | Status: AC | PRN
  Administered 2018-05-14: 5 mg via INTRAMUSCULAR
  Filled 2018-05-14: qty 10

## 2018-05-14 MED ORDER — LORAZEPAM 2 MG/ML IJ SOLN: 1.0000 mg | Freq: Every day | INTRAMUSCULAR | Status: DC | PRN

## 2018-05-14 MED ORDER — CEPHALEXIN 500 MG PO CAPS: 500.0000 mg | ORAL_CAPSULE | Freq: Two times a day (BID) | ORAL | Status: DC

## 2018-05-14 MED ORDER — SODIUM CHLORIDE 0.9 % IJ SOLN
INTRAMUSCULAR | Status: AC
  Administered 2018-05-14: 10 mL
  Filled 2018-05-14: qty 10

## 2018-05-14 MED ORDER — STERILE WATER FOR INJECTION IJ SOLN
INTRAMUSCULAR | Status: AC
  Administered 2018-05-14: 23:00:00
  Filled 2018-05-14: qty 10

## 2018-05-14 MED ORDER — LORAZEPAM 1 MG PO TABS: 1.0000 mg | ORAL_TABLET | Freq: Once | ORAL | Status: AC | PRN

## 2018-05-14 MED ORDER — LORAZEPAM 2 MG/ML IJ SOLN
1.0000 mg | Freq: Once | INTRAMUSCULAR | Status: AC | PRN
  Administered 2018-05-14: 1 mg via INTRAMUSCULAR
  Filled 2018-05-14: qty 1

## 2018-05-14 MED ORDER — LORAZEPAM 1 MG PO TABS
1.0000 mg | ORAL_TABLET | Freq: Every day | ORAL | Status: DC | PRN
  Filled 2018-05-14: qty 1

## 2018-05-14 MED ORDER — LORAZEPAM 1 MG PO TABS: 1.0000 mg | ORAL_TABLET | ORAL | Status: DC | PRN

## 2018-05-14 NOTE — Progress Notes (Signed)
Pt soft restraints removed all extremities.  Pt has pulses all extremities, skin warm to touch and no redness, swelling or bruising skin.  Pt does not wake up and continues to sleep.
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
## 2018-05-14 NOTE — Progress Notes (Signed)
05/14/18  0945  Patient placed back in bilateral wrist and ankle restraints. Patient is agitated, yelling, kicking, and trying to spit at staff.

## 2018-05-14 NOTE — BH Assessment (Signed)
[facility] Assessment Progress Note  Per Buford Dresser, DO this pt continues to require psychiatric hospitalization.  At 08:51 [name] calls from [facility].  He reports that they need a repeat urinalysis for this pt to determine if she has a UTI, and if so, she needs to be treated for it.  Repeat urinalysis, along with urine culture, and updated MAR, vital signs, and nurses' notes have been faxed to [facility].  At 15:31 [name] confirms receipt.  Final decision is pending as of this writing.  Jalene Mullet, [place] Coordinator [phone]

## 2018-05-14 NOTE — Progress Notes (Signed)
05/14/18  0815  Removed all restraints. At 0830 reapplied Left wrist restraint only. Patient has red bruising marks on wrists bilaterally. No bruising on ankles.

## 2018-05-14 NOTE — ED Notes (Signed)
Pt getting quieter. Has been screaming out for over an hour. Zyprexa given IM. Will continue to observe.

## 2018-05-14 NOTE — ED Notes (Signed)
Pt combative, attempting to crawl out of bed, screaming and yelling, trying to remove her mittens.  Order obtained for 4 point soft restraints and Zyprexa 5 mg IM. IM given to R upper quadrant.  Soft restraints applied x 4 and assessment started.
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
## 2018-05-15 ENCOUNTER — Other Ambulatory Visit: Payer: Self-pay

## 2018-05-15 ENCOUNTER — Encounter (HOSPITAL_COMMUNITY): Payer: Self-pay | Admitting: Internal Medicine

## 2018-05-15 DIAGNOSIS — N390 Urinary tract infection, site not specified: Secondary | ICD-10-CM | POA: Diagnosis present

## 2018-05-15 DIAGNOSIS — J329 Chronic sinusitis, unspecified: Secondary | ICD-10-CM | POA: Diagnosis present

## 2018-05-15 DIAGNOSIS — J324 Chronic pansinusitis: Secondary | ICD-10-CM | POA: Diagnosis not present

## 2018-05-15 DIAGNOSIS — F0391 Unspecified dementia with behavioral disturbance: Secondary | ICD-10-CM | POA: Diagnosis not present

## 2018-05-15 DIAGNOSIS — R4689 Other symptoms and signs involving appearance and behavior: Secondary | ICD-10-CM | POA: Diagnosis not present

## 2018-05-15 LAB — CBC WITH DIFFERENTIAL/PLATELET
BASOS ABS: 0 10*3/uL (ref 0.0–0.1)
Basophils Relative: 1 %
Eosinophils Absolute: 0.2 10*3/uL (ref 0.0–0.7)
Eosinophils Relative: 4 %
HCT: 45.4 % (ref 36.0–46.0)
HEMOGLOBIN: 14.6 g/dL (ref 12.0–15.0)
LYMPHS ABS: 1.5 10*3/uL (ref 0.7–4.0)
LYMPHS PCT: 27 %
MCH: 30.3 pg (ref 26.0–34.0)
MCHC: 32.2 g/dL (ref 30.0–36.0)
MCV: 94.2 fL (ref 78.0–100.0)
Monocytes Absolute: 0.7 10*3/uL (ref 0.1–1.0)
Monocytes Relative: 11 %
NEUTROS PCT: 57 %
Neutro Abs: 3.3 10*3/uL (ref 1.7–7.7)
PLATELETS: 203 10*3/uL (ref 150–400)
RBC: 4.82 MIL/uL (ref 3.87–5.11)
RDW: 14.1 % (ref 11.5–15.5)
WBC: 5.8 10*3/uL (ref 4.0–10.5)

## 2018-05-15 LAB — COMPREHENSIVE METABOLIC PANEL
ALK PHOS: 94 U/L (ref 38–126)
ALT: 18 U/L (ref 0–44)
AST: 28 U/L (ref 15–41)
Albumin: 3.6 g/dL (ref 3.5–5.0)
Anion gap: 10 (ref 5–15)
BUN: 21 mg/dL (ref 8–23)
CALCIUM: 9 mg/dL (ref 8.9–10.3)
CHLORIDE: 106 mmol/L (ref 98–111)
CO2: 29 mmol/L (ref 22–32)
CREATININE: 0.72 mg/dL (ref 0.44–1.00)
GFR calc non Af Amer: >60 mL/min (ref >60)
Glucose, Bld: 116 mg/dL — ABNORMAL HIGH (ref 70–99)
Potassium: 3.9 mmol/L (ref 3.5–5.1)
Sodium: 145 mmol/L (ref 135–145)
Total Bilirubin: 3 mg/dL — ABNORMAL HIGH (ref 0.3–1.2)
Total Protein: 6.7 g/dL (ref 6.5–8.1)

## 2018-05-15 LAB — URINE CULTURE: Culture: >=100000 — AB

## 2018-05-15 LAB — MRSA PCR SCREENING: MRSA BY PCR: NEGATIVE

## 2018-05-15 LAB — TSH: TSH: 0.46 u[IU]/mL (ref 0.350–4.500)

## 2018-05-15 LAB — I-STAT CG4 LACTIC ACID, ED: Lactic Acid, Venous: 1.23 mmol/L (ref 0.5–1.9)

## 2018-05-15 MED ORDER — SODIUM CHLORIDE 0.9 % IV BOLUS
1000.0000 mL | Freq: Once | INTRAVENOUS | Status: AC
  Administered 2018-05-15: 1000 mL via INTRAVENOUS

## 2018-05-15 MED ORDER — ONDANSETRON HCL 4 MG PO TABS: 4.0000 mg | ORAL_TABLET | Freq: Four times a day (QID) | ORAL | Status: DC | PRN

## 2018-05-15 MED ORDER — ENOXAPARIN SODIUM 40 MG/0.4ML ~~LOC~~ SOLN
40.0000 mg | Freq: Every day | SUBCUTANEOUS | Status: DC
  Administered 2018-05-16 – 2018-06-16 (×30): 40 mg via SUBCUTANEOUS
  Filled 2018-05-15 (×32): qty 0.4

## 2018-05-15 MED ORDER — ACETAMINOPHEN 325 MG PO TABS: 650.0000 mg | ORAL_TABLET | Freq: Four times a day (QID) | ORAL | Status: DC | PRN

## 2018-05-15 MED ORDER — SODIUM CHLORIDE 0.9 % IV SOLN
INTRAVENOUS | Status: AC
  Administered 2018-05-15: 12:00:00 via INTRAVENOUS

## 2018-05-15 MED ORDER — HYDROCODONE-ACETAMINOPHEN 5-325 MG PO TABS
1.0000 | ORAL_TABLET | ORAL | Status: DC | PRN
  Administered 2018-05-17: 1 via ORAL
  Administered 2018-05-26 – 2018-05-27 (×2): 2 via ORAL
  Administered 2018-06-02 – 2018-06-10 (×2): 1 via ORAL
  Filled 2018-05-15: qty 2
  Filled 2018-05-15 (×3): qty 1
  Filled 2018-05-15: qty 2

## 2018-05-15 MED ORDER — LORAZEPAM 2 MG/ML IJ SOLN
0.5000 mg | Freq: Four times a day (QID) | INTRAMUSCULAR | Status: DC | PRN
  Administered 2018-05-16 – 2018-05-18 (×4): 0.5 mg via INTRAVENOUS
  Filled 2018-05-15 (×5): qty 1

## 2018-05-15 MED ORDER — HALOPERIDOL LACTATE 5 MG/ML IJ SOLN
0.5000 mg | Freq: Once | INTRAMUSCULAR | Status: AC
  Administered 2018-05-15: 0.5 mg via INTRAVENOUS
  Filled 2018-05-15: qty 1

## 2018-05-15 MED ORDER — ONDANSETRON HCL 4 MG/2ML IJ SOLN: 4.0000 mg | Freq: Four times a day (QID) | INTRAMUSCULAR | Status: DC | PRN

## 2018-05-15 MED ORDER — LORAZEPAM 2 MG/ML IJ SOLN
INTRAMUSCULAR | Status: AC
  Filled 2018-05-15: qty 1

## 2018-05-15 MED ORDER — POLYETHYLENE GLYCOL 3350 17 G PO PACK
17.0000 g | PACK | Freq: Every day | ORAL | Status: DC | PRN
  Administered 2018-05-17: 17 g via ORAL
  Filled 2018-05-15: qty 1

## 2018-05-15 MED ORDER — SODIUM CHLORIDE 0.9 % IV SOLN
1.0000 g | Freq: Once | INTRAVENOUS | Status: AC
  Administered 2018-05-15: 1 g via INTRAVENOUS
  Filled 2018-05-15: qty 10

## 2018-05-15 MED ORDER — SODIUM CHLORIDE 0.9 % IV SOLN
1.0000 g | INTRAVENOUS | Status: DC
  Administered 2018-05-16: 1 g via INTRAVENOUS
  Filled 2018-05-15: qty 1

## 2018-05-15 MED ORDER — LORAZEPAM 2 MG/ML IJ SOLN
1.0000 mg | Freq: Four times a day (QID) | INTRAMUSCULAR | Status: DC | PRN
  Administered 2018-05-15: 1 mg via INTRAVENOUS

## 2018-05-15 MED ORDER — ACETAMINOPHEN 650 MG RE SUPP: 650.0000 mg | Freq: Four times a day (QID) | RECTAL | Status: DC | PRN

## 2018-05-15 NOTE — Progress Notes (Signed)
No change in look of wrists from soft wrist restraints. Pt had been continually fighting but has had medication and is now beginning to quiet down.
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
## 2018-05-15 NOTE — Progress Notes (Signed)
Wrists are not as red as they have been because pt has been sleeping for a couple of hours and no longer fighting against the restraints.

## 2018-05-15 NOTE — Progress Notes (Signed)
Patient is very combative and agitated. MD notified because nurse can not safely administer IM medications at this time for agitation.  New orders received.

## 2018-05-15 NOTE — Progress Notes (Signed)
CSW received phone call from husband, Shannyn Jankowiak. [name] called to grant CSW and staff verbal permission to speak with two family friends involved in the patient's care, as his health is declining.  The two additional contacts are:  June Fofley Clayton Lyons, [place] Social Worker [phone]

## 2018-05-15 NOTE — Progress Notes (Signed)
Patient arrived to unit with no restraints. Restraints will not be reapplied at this time. Will continue to monitor.

## 2018-05-15 NOTE — Progress Notes (Signed)
Unable to complete admission history. Patient is confused and combative. No family present. Gina Edwards

## 2018-05-15 NOTE — ED Notes (Signed)
Report called to 6th floor nurse.  Patient transported upstairs.

## 2018-05-15 NOTE — ED Notes (Signed)
Very little urine output this shift. Pt has been here almost three days and has no iv fluids and has taken in very little by mouth. Will report to oncoming RN.

## 2018-05-15 NOTE — ED Notes (Signed)
Rectal temp 96.6 

## 2018-05-15 NOTE — ED Provider Notes (Signed)
Called to evaluate patient who has been in the ED holding in TCU for behavioral health placement 77 year old female history of dementia, urinary incontinence, anxiety, depression, and meningioma of the brain who presented on October 2 with increased aggression.  She was seen and evaluated with labs and CT as well as chest x-Shaheer Bonfield.  Labs reviewed and revealed a normal CBC, slightly elevated bilirubin at 1.9, CT brain revealed pansinusitis stable meningioma.  Urinalysis obtained revealed 21-50 white blood cells on initial urinalysis with urine culture which grew out Klebsiella and patient was started on Keflex.  Home medications were noted to be Abilify, Lexapro.  Here in the ED she has been given Abilify, Keflex, Klonopin, Lexapro, with as needed Ativan and IM Zyprexa if unable to take p.o. Currently axillary temp is reported as 98.6, heart rate 82, blood pressure 156/97 oxygen saturations 100%.  Nursing reports that she is less responsive but has not been taking p.o. with decreased urinary output.  pe wdwn No trauma noted to head Some facial asymmetry but moves equally No redness or ttp CV-rrr Lungs cta Abdomen-firm with distension  Extremities- erythema bilateral forearm   Patient wakes up and answers questions but not oriented to place and asks what place is this?  Plan  1- bladder scan to assess if retention 2-IV rocephin to cover uti and pansinusitis seen on ct- patient on keflex but has not taken po keflex since yesterday am.  Nursing states not taking po but has apparently taken seroqul 3- will assess volume status and for sepsis with lab and vitals Patient with low bp  Plan - will likey need medical admission for further treatment as has deteriorated with sedation to control aggressive behavior and now with inability to take po meds due to sedation. 9:00 AM Bladder scan 329 at this time.  No urine output overnight.  Will have Foley placed.  Discussed with Dr. Roel Cluck and she will see  for medical  admission Today's CBC reviewed and normal Lactic acid reviewed and normal Chemistries pending and Dr. Roel Cluck aware    Pattricia Boss, MD 05/15/18 [phone]
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
## 2018-05-15 NOTE — Progress Notes (Signed)
Patient continues to be combative with no relief noted with Ativan. Bilateral mittens are on but patient continues to remove mittens swinging, kicking, cursing and biting at staff.
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
## 2018-05-15 NOTE — Progress Notes (Signed)
Bilateral wrists are reddened and excoriated from pt fighting against soft wrist restraints.

## 2018-05-15 NOTE — ED Notes (Signed)
Removed restraints for trial period. Forearms remain reddened with abrasions from pt pulling on wrist restraints. Will observe and replace restraints if pt not able to stay calm and safe.

## 2018-05-16 DIAGNOSIS — N3000 Acute cystitis without hematuria: Secondary | ICD-10-CM | POA: Diagnosis not present

## 2018-05-16 DIAGNOSIS — F0391 Unspecified dementia with behavioral disturbance: Secondary | ICD-10-CM | POA: Diagnosis not present

## 2018-05-16 DIAGNOSIS — N390 Urinary tract infection, site not specified: Secondary | ICD-10-CM | POA: Diagnosis not present

## 2018-05-16 DIAGNOSIS — R4689 Other symptoms and signs involving appearance and behavior: Secondary | ICD-10-CM | POA: Diagnosis not present

## 2018-05-16 DIAGNOSIS — J324 Chronic pansinusitis: Secondary | ICD-10-CM | POA: Diagnosis not present

## 2018-05-16 DIAGNOSIS — Z008 Encounter for other general examination: Secondary | ICD-10-CM

## 2018-05-16 LAB — COMPREHENSIVE METABOLIC PANEL
ALT: 15 U/L (ref 0–44)
AST: 22 U/L (ref 15–41)
Albumin: 2.9 g/dL — ABNORMAL LOW (ref 3.5–5.0)
Alkaline Phosphatase: 70 U/L (ref 38–126)
Anion gap: 10 (ref 5–15)
BILIRUBIN TOTAL: 2.1 mg/dL — AB (ref 0.3–1.2)
BUN: 10 mg/dL (ref 8–23)
CALCIUM: 8.2 mg/dL — AB (ref 8.9–10.3)
CHLORIDE: 107 mmol/L (ref 98–111)
CO2: 23 mmol/L (ref 22–32)
CREATININE: 0.57 mg/dL (ref 0.44–1.00)
Glucose, Bld: 94 mg/dL (ref 70–99)
Potassium: 3.9 mmol/L (ref 3.5–5.1)
Sodium: 140 mmol/L (ref 135–145)
TOTAL PROTEIN: 5.2 g/dL — AB (ref 6.5–8.1)

## 2018-05-16 LAB — MAGNESIUM: Magnesium: 2 mg/dL (ref 1.7–2.4)

## 2018-05-16 LAB — CBC
HCT: 39.5 % (ref 36.0–46.0)
Hemoglobin: 12.9 g/dL (ref 12.0–15.0)
MCH: 30.3 pg (ref 26.0–34.0)
MCHC: 32.7 g/dL (ref 30.0–36.0)
MCV: 92.7 fL (ref 78.0–100.0)
Platelets: 167 10*3/uL (ref 150–400)
RBC: 4.26 MIL/uL (ref 3.87–5.11)
RDW: 14.1 % (ref 11.5–15.5)
WBC: 6.9 10*3/uL (ref 4.0–10.5)

## 2018-05-16 LAB — PHOSPHORUS: PHOSPHORUS: 3.8 mg/dL (ref 2.5–4.6)

## 2018-05-16 MED ORDER — CEPHALEXIN 500 MG PO CAPS
500.0000 mg | ORAL_CAPSULE | Freq: Two times a day (BID) | ORAL | Status: AC
  Administered 2018-05-17 – 2018-05-21 (×10): 500 mg via ORAL
  Filled 2018-05-16 (×10): qty 1

## 2018-05-16 MED ORDER — ORAL CARE MOUTH RINSE
15.0000 mL | Freq: Two times a day (BID) | OROMUCOSAL | Status: DC
  Administered 2018-05-16 – 2018-06-17 (×49): 15 mL via OROMUCOSAL

## 2018-05-16 NOTE — Progress Notes (Addendum)
Patient continues to scream and kick at staff. Patient got one arm out of restraint and immediately started swinging at staff.
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
## 2018-05-16 NOTE — Progress Notes (Signed)
Nurse attempted trial out of bilateral soft wrist restraints and patient became violent screaming and swinging at staff. Restraints replaced. Patient continues to be tearful with rambling conversations.

## 2018-05-16 NOTE — Progress Notes (Signed)
PT Cancellation Note  Patient Details Name: Gina Edwards MRN: [id number] DOB: 10/11/1940   Cancelled Treatment:    Reason Eval/Treat Not Completed: Medical issues which prohibited therapy(pt sleeping and difficult to arouse per RN, will check back tomorrow. )  Philomena Doheny PT 05/16/2018  Acute Rehabilitation Services Pager [phone] Office [phone]

## 2018-05-16 NOTE — Progress Notes (Signed)
Unable to complete admission history due to patient being confused and combative with no family present.

## 2018-05-16 NOTE — Progress Notes (Signed)
Patient has been sleeping soundly this am unable to arouse enough to take oral meds. Patient attempts to swing at nurse as nurse administers lovenox.

## 2018-05-17 ENCOUNTER — Other Ambulatory Visit: Payer: Self-pay

## 2018-05-17 DIAGNOSIS — F0391 Unspecified dementia with behavioral disturbance: Secondary | ICD-10-CM | POA: Diagnosis not present

## 2018-05-17 DIAGNOSIS — Z008 Encounter for other general examination: Secondary | ICD-10-CM | POA: Diagnosis not present

## 2018-05-17 DIAGNOSIS — R4689 Other symptoms and signs involving appearance and behavior: Secondary | ICD-10-CM | POA: Diagnosis not present

## 2018-05-17 DIAGNOSIS — J324 Chronic pansinusitis: Secondary | ICD-10-CM | POA: Diagnosis not present

## 2018-05-17 DIAGNOSIS — N390 Urinary tract infection, site not specified: Secondary | ICD-10-CM | POA: Diagnosis not present

## 2018-05-17 NOTE — Progress Notes (Signed)
2nd shift ED CSW received a handoff from the 1st shift [facility] ED CSW.   1st shift reviewed chart and sees note from 1st shift ED CSW:  "CSW received phone call from June Sofley, patient's close friend. CSW aware patient's husband gave verbal permission to speak to and discuss patient's health/updates with June Sofley [phone] and Barrington Ellison [phone]. Per June, patient's husband was taken to [facility] yesterday as he had a major decline in his health over the weekend".  CSW will inform pt's RN the above have permission from the pt's husband who is in [facility] to be updated regarding the pt's health.  5:52 PM  RN updated.

## 2018-05-17 NOTE — Plan of Care (Signed)
?  Problem: Elimination: ?Goal: Will not experience complications related to bowel motility ?Outcome: Progressing ?  ?Problem: Pain Managment: ?Goal: General experience of comfort will improve ?Outcome: Progressing ?  ?Problem: Safety: ?Goal: Ability to remain free from injury will improve ?Outcome: Progressing ?  ?

## 2018-05-17 NOTE — Care Management Obs Status (Signed)
[place] NOTIFICATION   Patient Details  Name: NAKEA GOUGER MRN: [id number] Date of Birth: 22-Feb-1941   Medicare Observation Status Notification Given:  Yes    Lynnell Catalan, RN 05/17/2018, 3:39 PM

## 2018-05-17 NOTE — Progress Notes (Addendum)
CSW received phone call from June Fofley, patient's close friend. CSW aware patient's husband gave verbal permission to speak to and discuss patient with June Fofley [phone] and Barrington Ellison [phone]. Per June, patient's husband was taken to [facility] yesterday as he had a major decline in his health over the weekend. CSW informed June that patient has been admitted under observation and is no longer in the ED. CSW informed June that, to her knowledge, the plan was still to find geropsych placement once patient is medically stable for discharge. June expressed understanding and has requested she and Vaughan Basta be updated of any new information. CSW to leave handoff for inpatient CSW.  2:15pm- CSW received phone call from Morey Hummingbird with [place] caseworker [phone], requesting an update on patient disposition. CSW informed Abigail Butts that patient has been admitted under observation. Per prior conversation with Abigail Butts, patient's husband is her POA. If patient's husband is no longer deemed competent or he passes, patient's son Gerald Stabs [phone] becomes POA. Per prior conversation, patient's son Gerald Stabs is in the [employer] and is getting ready to be deployed to [place] for 9 months. Abigail Butts unsure when Gerald Stabs is supposed to leave or if he's postponed deployment. Per Abigail Butts, she was going to see patient's husband today and assess where he is at. Abigail Butts informed CSW that in the event ALF placement was sought from hospital that patient's family can pay 3 months upfront. Per Abigail Butts, she would continue to check in with CSW regarding patient disposition.  Ollen Barges, [place] Work Department  [employer]  [phone]
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
## 2018-05-17 NOTE — Progress Notes (Signed)
The nurse attempted a trial out of bilateral soft wrist restraints, however the patient became violent, hitting and grabbing at staff, trying to get out of bed. The nurse paged the physician to reinstate the restraint order. Will continue to monitor.

## 2018-05-17 NOTE — Progress Notes (Signed)
PT Cancellation Note  Patient Details Name: Gina Edwards MRN: [id number] DOB: February 09, 1941   Cancelled Treatment:    Reason Eval/Treat Not Completed: Fatigue/lethargy limiting ability to participate. Patient is asleep, resting. Not arousable nor  Does nursing want patient aroused at this time. [place] Pager [phone] Office [phone]    Claretha Cooper 05/17/2018, 9:49 AM

## 2018-05-17 NOTE — Progress Notes (Signed)
OT Cancellation Note  Patient Details Name: MIYAH HAMPSHIRE MRN: [id number] DOB: 21-Oct-1940   Cancelled Treatment:    Reason Eval/Treat Not Completed: Other (comment)  Fatigue/lethargy limiting ability to participate. Patient is asleep, resting. Not arousable nor  Does nursing want patient aroused at this time.  [place], Mickel Baas, OT Acute Rehabilitation Services [phone] [phone]    05/17/2018, 1:22 PM

## 2018-05-18 DIAGNOSIS — R4689 Other symptoms and signs involving appearance and behavior: Secondary | ICD-10-CM | POA: Diagnosis not present

## 2018-05-18 DIAGNOSIS — F0391 Unspecified dementia with behavioral disturbance: Secondary | ICD-10-CM | POA: Diagnosis present

## 2018-05-18 DIAGNOSIS — Z008 Encounter for other general examination: Secondary | ICD-10-CM | POA: Diagnosis not present

## 2018-05-18 DIAGNOSIS — Z781 Physical restraint status: Secondary | ICD-10-CM | POA: Diagnosis not present

## 2018-05-18 DIAGNOSIS — M6281 Muscle weakness (generalized): Secondary | ICD-10-CM | POA: Diagnosis present

## 2018-05-18 DIAGNOSIS — N3000 Acute cystitis without hematuria: Secondary | ICD-10-CM | POA: Diagnosis not present

## 2018-05-18 DIAGNOSIS — F319 Bipolar disorder, unspecified: Secondary | ICD-10-CM | POA: Diagnosis present

## 2018-05-18 DIAGNOSIS — G301 Alzheimer's disease with late onset: Secondary | ICD-10-CM | POA: Diagnosis not present

## 2018-05-18 DIAGNOSIS — F419 Anxiety disorder, unspecified: Secondary | ICD-10-CM | POA: Diagnosis present

## 2018-05-18 DIAGNOSIS — N390 Urinary tract infection, site not specified: Secondary | ICD-10-CM | POA: Diagnosis present

## 2018-05-18 DIAGNOSIS — R1311 Dysphagia, oral phase: Secondary | ICD-10-CM | POA: Diagnosis present

## 2018-05-18 DIAGNOSIS — F0281 Dementia in other diseases classified elsewhere with behavioral disturbance: Secondary | ICD-10-CM | POA: Diagnosis not present

## 2018-05-18 DIAGNOSIS — J324 Chronic pansinusitis: Secondary | ICD-10-CM | POA: Diagnosis present

## 2018-05-18 DIAGNOSIS — R456 Violent behavior: Secondary | ICD-10-CM | POA: Diagnosis present

## 2018-05-18 DIAGNOSIS — Z23 Encounter for immunization: Secondary | ICD-10-CM | POA: Diagnosis not present

## 2018-05-18 DIAGNOSIS — N393 Stress incontinence (female) (male): Secondary | ICD-10-CM | POA: Diagnosis present

## 2018-05-18 DIAGNOSIS — Z7401 Bed confinement status: Secondary | ICD-10-CM | POA: Diagnosis not present

## 2018-05-18 DIAGNOSIS — Z885 Allergy status to narcotic agent status: Secondary | ICD-10-CM | POA: Diagnosis not present

## 2018-05-18 DIAGNOSIS — M2550 Pain in unspecified joint: Secondary | ICD-10-CM | POA: Diagnosis not present

## 2018-05-18 DIAGNOSIS — Z7189 Other specified counseling: Secondary | ICD-10-CM | POA: Diagnosis not present

## 2018-05-18 DIAGNOSIS — R4182 Altered mental status, unspecified: Secondary | ICD-10-CM | POA: Diagnosis not present

## 2018-05-18 DIAGNOSIS — R451 Restlessness and agitation: Secondary | ICD-10-CM | POA: Diagnosis not present

## 2018-05-18 DIAGNOSIS — R0603 Acute respiratory distress: Secondary | ICD-10-CM | POA: Diagnosis present

## 2018-05-18 DIAGNOSIS — D320 Benign neoplasm of cerebral meninges: Secondary | ICD-10-CM | POA: Diagnosis present

## 2018-05-18 DIAGNOSIS — Z515 Encounter for palliative care: Secondary | ICD-10-CM | POA: Diagnosis not present

## 2018-05-18 DIAGNOSIS — Z881 Allergy status to other antibiotic agents status: Secondary | ICD-10-CM | POA: Diagnosis not present

## 2018-05-18 DIAGNOSIS — B961 Klebsiella pneumoniae [K. pneumoniae] as the cause of diseases classified elsewhere: Secondary | ICD-10-CM | POA: Diagnosis present

## 2018-05-18 DIAGNOSIS — O479 False labor, unspecified: Secondary | ICD-10-CM | POA: Diagnosis not present

## 2018-05-18 DIAGNOSIS — F23 Brief psychotic disorder: Secondary | ICD-10-CM | POA: Diagnosis present

## 2018-05-18 DIAGNOSIS — Z8249 Family history of ischemic heart disease and other diseases of the circulatory system: Secondary | ICD-10-CM | POA: Diagnosis not present

## 2018-05-18 DIAGNOSIS — Z66 Do not resuscitate: Secondary | ICD-10-CM | POA: Diagnosis present

## 2018-05-18 DIAGNOSIS — F05 Delirium due to known physiological condition: Secondary | ICD-10-CM | POA: Diagnosis present

## 2018-05-18 DIAGNOSIS — J690 Pneumonitis due to inhalation of food and vomit: Secondary | ICD-10-CM | POA: Diagnosis present

## 2018-05-18 DIAGNOSIS — R0902 Hypoxemia: Secondary | ICD-10-CM | POA: Diagnosis not present

## 2018-05-18 DIAGNOSIS — Z79899 Other long term (current) drug therapy: Secondary | ICD-10-CM | POA: Diagnosis not present

## 2018-05-18 DIAGNOSIS — F0151 Vascular dementia with behavioral disturbance: Secondary | ICD-10-CM | POA: Diagnosis not present

## 2018-05-18 DIAGNOSIS — R2689 Other abnormalities of gait and mobility: Secondary | ICD-10-CM | POA: Diagnosis present

## 2018-05-18 MED ORDER — OLANZAPINE 5 MG PO TABS
2.5000 mg | ORAL_TABLET | Freq: Two times a day (BID) | ORAL | Status: DC
  Administered 2018-05-18 – 2018-05-20 (×5): 2.5 mg via ORAL
  Filled 2018-05-18 (×5): qty 1

## 2018-05-18 MED ORDER — LORAZEPAM 2 MG/ML IJ SOLN
0.5000 mg | Freq: Once | INTRAMUSCULAR | Status: AC
  Administered 2018-05-18: 0.5 mg via INTRAVENOUS
  Filled 2018-05-18: qty 1

## 2018-05-18 MED ORDER — DIVALPROEX SODIUM 125 MG PO CSDR
250.0000 mg | DELAYED_RELEASE_CAPSULE | Freq: Two times a day (BID) | ORAL | Status: DC
  Administered 2018-05-18 – 2018-06-08 (×41): 250 mg via ORAL
  Filled 2018-05-18 (×46): qty 2

## 2018-05-18 MED ORDER — ENSURE ENLIVE PO LIQD
237.0000 mL | Freq: Two times a day (BID) | ORAL | Status: DC
  Administered 2018-05-20 – 2018-06-18 (×26): 237 mL via ORAL

## 2018-05-18 MED ORDER — LORAZEPAM 2 MG/ML IJ SOLN
1.0000 mg | Freq: Four times a day (QID) | INTRAMUSCULAR | Status: DC | PRN
  Administered 2018-05-19 – 2018-06-01 (×5): 1 mg via INTRAVENOUS
  Filled 2018-05-18 (×5): qty 1

## 2018-05-18 MED ORDER — LORAZEPAM 2 MG/ML IJ SOLN: 1.0000 mg | Freq: Four times a day (QID) | INTRAMUSCULAR | Status: DC | PRN

## 2018-05-18 MED ORDER — LORAZEPAM 1 MG PO TABS
1.0000 mg | ORAL_TABLET | Freq: Four times a day (QID) | ORAL | Status: DC | PRN
  Administered 2018-05-20 – 2018-06-08 (×9): 1 mg via ORAL
  Filled 2018-05-18 (×12): qty 1

## 2018-05-18 NOTE — Progress Notes (Signed)
Pt has been weepy today and combative at times. She has denied food despite several attempts. She has had a few bites of food. Nurse and sitter have offered fluids throughout the shift and the pt has taken occasional sips and about 240 mls juice.

## 2018-05-18 NOTE — Progress Notes (Addendum)
CSW received phone call from Maunie, patient's daughter-in-law [phone]. Per Edmonia Lynch, patient's husband passed away last night. Edmonia Lynch stated that patient's son, Gerald Stabs, is now [place] [phone]. Per Ferd Hibbs would be back in [place] tomorrow. Jeannie informed CSW that Gerald Stabs has requested to delay his deployment but is unsure if it will be granted. Per notes, psychiatry has been reconsulted for patient. CSW to follow up with psychiatry.   11:43am- CSW spoke with Garnet Sierras, charge nurse at [facility], regarding referral sent on patient last week. CSW inquired about if CSW needed to submit a new referral since patient was admitted inpatient. CSW informed that CSW could fax over new documents and Pamala Hurry would review them. CSW has faxed over additional documentation.   1:38pm- CSW spoke with Pamala Hurry to ensure they received patient's additional documentation. Per Pamala Hurry, she has received the paperwork and confirms patient is on the wait list to be accepted to facility. Per Pamala Hurry, she would reach out to CSW with any updates. CSW will continue to follow.  Ollen Barges, [place] Work Department  [employer]  [phone]

## 2018-05-18 NOTE — Progress Notes (Signed)
OT Cancellation Note  Patient Details Name: EARNESTINE SHIPP MRN: [id number] DOB: 1941-07-30   Cancelled Treatment:    Reason Eval/Treat Not Completed: Other (comment)  Pt spitting and being uncooperative per CNA- will defer OT eval until more cooperative  Kari Baars, [place] [phone] Office- [phone], Edwena Felty D 05/18/2018, 1:53 PM

## 2018-05-18 NOTE — Progress Notes (Signed)
Initial Nutrition Assessment  INTERVENTION:   Provide Ensure Enlive po BID, each supplement provides 350 kcal and 20 grams of protein  NUTRITION DIAGNOSIS:   Inadequate oral intake related to (AMS, dementia) as evidenced by meal completion < 50%.  GOAL:   Patient will meet greater than or equal to 90% of their needs  MONITOR:   PO intake, Supplement acceptance, Labs, Weight trends, I & O's  REASON FOR ASSESSMENT:   Malnutrition Screening Tool    ASSESSMENT:   77 y.o. female with a history of dementia, anxiety, and depression who presented from home with agitation and progressively worsening aggressive behavior  Patient still exhibiting aggression toward staff requiring sedatives.  Pt has not been weighed since June 2019 but suspect pt has been difficult to weigh given mental status. Pt consuming 50% of meals per chart. Pt would benefit from nutritional supplements if she will drink/accept them. Will place order but will need to see if she will drink them.  Labs reviewed. Medications: vitamin D tablet daily  NUTRITION - FOCUSED PHYSICAL EXAM:  Inappropriate given AMS and aggression.  Diet Order:   Diet Order            Diet Heart Room service appropriate? Yes; Fluid consistency: Thin  Diet effective now              EDUCATION NEEDS:   Not appropriate for education at this time  Skin:  Skin Assessment: Reviewed RN Assessment  Last BM:  PTA  Height:   Ht Readings from Last 1 Encounters:  02/06/18 5\' 6"  (1.676 m)    Weight:   Wt Readings from Last 1 Encounters:  02/06/18 64.4 kg    Ideal Body Weight:  (No height measurement in chart)  BMI:  There is no height or weight on file to calculate BMI.  Estimated Nutritional Needs:   Kcal:  unable to calculate  Protein:     Fluid:     Clayton Bibles, MS, RD, LDN [place] Dietitian Pager: [phone] After Hours Pager: [phone]

## 2018-05-18 NOTE — Consult Note (Signed)
[facility] Psych Consult Progress Note  05/18/2018 12:25 PM [name]  MRN:  [id number] Subjective:   [name] was last seen by the psychiatry consult service on 10/5 for psychosis. Her medications were continued and was recommended for inpatient psychiatric admission. Psychiatry is reconsulted for worsening agitation. She has required intermittent restraints. Home medications include Abilify 5 mg daily, Klonopin 0.5 mg daily (changed from Xanax since admission), Seroquel 25 mg qhs (started since admission) and Lexapro 10 mg daily. She received a total of 1 mg of Ativan this morning.   On interview, [name] is only oriented to person. She believes that the date is "25" and she is 77 years old. She reports that she is at [facility] and she lives here. She is very confused and mentions multiple nonsensical statements. She denies   Principal Problem: Dementia with behavioral disturbance ([name]) Diagnosis:   Patient Active Problem List   Diagnosis Date Noted  . Evaluation by psychiatric service required [Z00.8]   . Acute lower UTI [N39.0] 05/15/2018  . Sinusitis [J32.9] 05/15/2018  . UTI (urinary tract infection) [N39.0] 05/15/2018  . Pansinusitis [J32.4] 05/15/2018  . Aggressive behavior [R46.89]   . Dementia with behavioral disturbance ([name]) [F03.91] 12/02/2012  . Depression [F32.9] 12/02/2012  . Anxiety [F41.9] 12/02/2012  . H/O meningioma of the brain [Z86.011] 12/02/2012   Total Time spent with patient: 15 minutes  Past Psychiatric History: Dementia, depression and anxiety.   Past Medical History:  Past Medical History:  Diagnosis Date  . Anxiety 12/02/2012  . Anxiety and depression   . Dementia ([name]) 12/02/2012  . Depression 12/02/2012  . H/O meningioma of the brain 12/02/2012  . SUI (stress urinary incontinence, female)     Past Surgical History:  Procedure Laterality Date  . APPENDECTOMY  1957  . TUBAL LIGATION Bilateral 1975   Family History:  Family History  Problem  Relation Age of Onset  . Heart disease Mother   . Colon cancer Mother 10  . Prostate cancer Father    Family Psychiatric  History: Unknown  Social History:  Social History   Substance and Sexual Activity  Alcohol Use No     Social History   Substance and Sexual Activity  Drug Use No    Social History   Socioeconomic History  . Marital status: Married    Spouse name: Not on file  . Number of children: Not on file  . Years of education: Not on file  . Highest education level: Not on file  Occupational History  . Not on file  Social Needs  . Financial resource strain: Not on file  . Food insecurity:    Worry: Not on file    Inability: Not on file  . Transportation needs:    Medical: Not on file    Non-medical: Not on file  Tobacco Use  . Smoking status: Never Smoker  . Smokeless tobacco: Never Used  Substance and Sexual Activity  . Alcohol use: No  . Drug use: No  . Sexual activity: Yes    Partners: Male    Birth control/protection: Post-menopausal  Lifestyle  . Physical activity:    Days per week: Not on file    Minutes per session: Not on file  . Stress: Not on file  Relationships  . Social connections:    Talks on phone: Not on file    Gets together: Not on file    Attends religious service: Not on file    Active member  of club or organization: Not on file    Attends meetings of clubs or organizations: Not on file    Relationship status: Not on file  Other Topics Concern  . Not on file  Social History Narrative   Pt live at home with spouse.    Caffeine Use- 24oz    Sleep: Fair  Appetite:  Poor  Current Medications: Current Facility-Administered Medications  Medication Dose Route Frequency Provider Last Rate Last Dose  . acetaminophen (TYLENOL) tablet 650 mg  650 mg Oral Q6H PRN Toy Baker, MD       Or  . acetaminophen (TYLENOL) suppository 650 mg  650 mg Rectal Q6H PRN Doutova, Anastassia, MD      . ARIPiprazole (ABILIFY) tablet 5 mg   5 mg Oral Daily Doutova, Anastassia, MD   5 mg at 05/18/18 1101  . cephALEXin (KEFLEX) capsule 500 mg  500 mg Oral Q12H Patrecia Pour, MD   500 mg at 05/18/18 1057  . cholecalciferol (VITAMIN D) tablet 2,000 Units  2,000 Units Oral Daily Toy Baker, MD   2,000 Units at 05/18/18 1135  . clonazePAM (KLONOPIN) tablet 0.5 mg  0.5 mg Oral Daily Doutova, Anastassia, MD   0.5 mg at 05/18/18 1057  . enoxaparin (LOVENOX) injection 40 mg  40 mg Subcutaneous Daily Doutova, Anastassia, MD   40 mg at 05/17/18 1050  . escitalopram (LEXAPRO) tablet 10 mg  10 mg Oral Daily Doutova, Anastassia, MD   10 mg at 05/18/18 1057  . feeding supplement (ENSURE ENLIVE) (ENSURE ENLIVE) liquid 237 mL  237 mL Oral BID BM Patrecia Pour, MD      . HYDROcodone-acetaminophen (NORCO/VICODIN) 5-325 MG per tablet 1-2 tablet  1-2 tablet Oral Q4H PRN Toy Baker, MD   1 tablet at 05/17/18 1050  . LORazepam (ATIVAN) injection 0.5 mg  0.5 mg Intravenous Q6H PRN Toy Baker, MD   0.5 mg at 05/18/18 0926  . MEDLINE mouth rinse  15 mL Mouth Rinse BID Toy Baker, MD   15 mL at 05/18/18 0811  . ondansetron (ZOFRAN) tablet 4 mg  4 mg Oral Q6H PRN Doutova, Anastassia, MD       Or  . ondansetron (ZOFRAN) injection 4 mg  4 mg Intravenous Q6H PRN Doutova, Anastassia, MD      . polyethylene glycol (MIRALAX / GLYCOLAX) packet 17 g  17 g Oral Daily PRN Doutova, Anastassia, MD   17 g at 05/17/18 1050  . QUEtiapine (SEROQUEL) tablet 25 mg  25 mg Oral QHS Toy Baker, MD   25 mg at 05/17/18 2207  . sodium chloride 0.9 % bolus 500 mL  500 mL Intravenous Once Toy Baker, MD        Lab Results: No results found for this or any previous visit (from the past 48 hour(s)).  Blood Alcohol level:  Lab Results  Component Value Date   ETH <10 05/12/2018     Musculoskeletal: Strength & Muscle Tone: Generalized weakness Gait & Station: UTA since patient is in 2 point restraints. Patient leans:  N/A  Psychiatric Specialty Exam: Physical Exam  Nursing note and vitals reviewed. Constitutional: She appears well-developed and well-nourished.  HENT:  Head: Normocephalic and atraumatic.  Neck: Normal range of motion.  Respiratory: Effort normal.  Musculoskeletal: Normal range of motion.  Neurological: She is alert.  Only oriented to person.  Psychiatric: She has a normal mood and affect. Judgment and thought content normal. Her speech is slurred. She is slowed. Cognition and memory  are impaired.    Review of Systems  Cardiovascular: Negative for chest pain.  Gastrointestinal: Negative for abdominal pain, constipation, diarrhea, nausea and vomiting.  Psychiatric/Behavioral: Negative for hallucinations and suicidal ideas.  All other systems reviewed and are negative.   Blood pressure (!) 156/88, pulse 88, temperature (!) 97.5 F (36.4 C), temperature source Axillary, resp. rate 20, SpO2 100 %.There is no height or weight on file to calculate BMI.  General Appearance: Fairly Groomed, elderly, Caucasian female, wearing a hospital gown and lying in bed in 2 point restraints. NAD.   Eye Contact:  Fair  Speech:  Slow and Slurred  Volume:  Normal  Mood:  Dysphoric  Affect:  Congruent  Thought Process:  Disorganized and Descriptions of Associations: Tangential  Orientation:  Other:  Only oriented to self.  Thought Content:  Illogical  Suicidal Thoughts:  No  Homicidal Thoughts:  No  Memory:  Immediate;   Poor Recent;   Poor Remote;   Poor  Judgement:  Impaired  Insight:  Lacking  Psychomotor Activity:  Decreased  Concentration:  Concentration: Fair and Attention Span: Fair  Recall:  Poor  Fund of Knowledge:  Poor  Language:  Fair  Akathisia:  No  Handed:  Right  AIMS (if indicated):   N/A  Assets:  Housing Social Support  ADL's:  Impaired  Cognition:  Impaired. History of dementia.   Sleep:   N/A   Assessment:  IVALENE PLATTE is a 77 y.o. female who was admitted with  agitation in the setting of dementia. She remains agitated in the setting of UTI treatment. She is nonsensical in thought process and only oriented to self. She continues to warrant inpatient psychiatric hospitalization for stabilization and treatment.     Treatment Plan Summary: -Patient continues to warrant inpatient psychiatric hospitalization given high risk of harm to self/agition. -Continue bedside sitter.  -Discontinue Seroquel and Abilify. Start Zyprexa 2.5 mg BID for agitation and poor appetite. Increase as needed for symptom management.  -Can start Depakote 250 mg BID for mood stabilization/agitation if Zyprexa is only partially effective. -Continue Lexapro 10 mg daily for mood.  -Continue Klonopin 0.5 mg daily for agitation.  -EKG reviewed and QTc 462 on 10/5. Please closely monitor when starting or increasing QTc prolonging agents.  -Psychiatry will sign off on patient at this time. Please consult psychiatry again as needed.     Faythe Dingwall, DO 05/18/2018, 12:25 PM

## 2018-05-18 NOTE — Progress Notes (Signed)
PT Cancellation Note  Patient Details Name: CHAKIRA JACHIM MRN: [id number] DOB: [birth year]   Cancelled Treatment:    Reason Eval/Treat Not Completed: Other (comment), as per OT note, patient unable to participate/    check back another time. If unable  To participate will sign off until patient can participate.   Claretha Cooper 05/18/2018, 2:34 PM  [place] Pager [phone] Office [phone]
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
## 2018-05-19 MED ORDER — POTASSIUM CHLORIDE 2 MEQ/ML IV SOLN
INTRAVENOUS | Status: DC
  Administered 2018-05-19: 12:00:00 via INTRAVENOUS
  Filled 2018-05-19 (×4): qty 1000

## 2018-05-19 NOTE — Progress Notes (Signed)
CSW following- pt awaiting treatment at geriatric psychiatric facility once medically stable and once bed available.  Currently on [facility] waiting list- see past notes for details of progression of case. Pt's IVC expiring 6:05pm today- per psychiatric consult update yesterday and MD evaluation today, pt still in need of psychiatric inpatient treatment and IVC for safety. CSW assisted attending in completing IVC renewal- custody order received and placed on chart- per dispatch, [facility] will be dispatched to [facility] to serve custody order. Once served IVC will be active until 05/26/18.  Sharren Bridge, MSW, LCSW Clinical Social Work 05/19/2018 [phone]
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
## 2018-05-19 NOTE — Evaluation (Signed)
Physical Therapy Evaluation Patient Details Name: Gina Edwards MRN: [id number] DOB: 1941-03-14 Today's Date: 05/19/2018   History of Present Illness  Pt was admitted for dementia with behavioral disturbance.  PMH: anxiety and depression  Clinical Impression  The patient  Is catatonic. Sitter I room familiar with patient and reported that patient was ambulatory in ED. Patient currently lethargic and requires 2 assistance and  Suspected  Due to medications for behavior . Continue TRIAL OF PT for resumption of ambulatory status to DC to [facility] psych unit. Pt admitted with above diagnosis. Pt currently with functional limitations due to the deficits listed below (see PT Problem List).  Pt will benefit from skilled PT to increase their independence and safety with mobility to allow discharge to the venue listed below.       Follow Up Recommendations No PT follow up(plans for geri psyche unit)    Equipment Recommendations  None recommended by PT    Recommendations for Other Services       Precautions / Restrictions Precautions Precautions: Fall Precaution Comments: aggressive behavior at times including hitting, biting, spitting Restrictions Weight Bearing Restrictions: No      Mobility  Bed Mobility Overal bed mobility: Needs Assistance Bed Mobility: Supine to Sit;Sit to Supine     Supine to sit: Total assist Sit to supine: Total assist   General bed mobility comments: pt initiated putting legs back in bed but was unable to do so  Transfers Overall transfer level: Needs assistance Equipment used: 2 person hand held assist Transfers: Sit to/from Stand;Stand Pivot Transfers Sit to Stand: Max assist;+2 physical assistance;+2 safety/equipment Stand pivot transfers: Max assist;+2 physical assistance;+2 safety/equipment       General transfer comment: modified SPT; pt did not stand completely.  Took small steps back to bed, requires 2 assist for support and balance. Patient is  catatonic  Ambulation/Gait                Stairs            Wheelchair Mobility    Modified Rankin (Stroke Patients Only)       Balance Overall balance assessment: Needs assistance   Sitting balance-Leahy Scale: Fair       Standing balance-Leahy Scale: Zero                               Pertinent Vitals/Pain Pain Assessment: No/denies pain Pain Score: 0-No pain    Home Living Family/patient expects to be discharged to:: (geri Psych unit)                 Additional Comments: Per chart, patient's spouse has passed away.     Prior Function           Comments: Per sitter and chart, patient ambualtory in ED. Now is catatonic and minimally able to move. Has been aggressive and medicated for behavioral issues.     Hand Dominance        Extremity/Trunk Assessment   Upper Extremity Assessment Upper Extremity Assessment: Defer to OT evaluation    Lower Extremity Assessment Lower Extremity Assessment: Generalized weakness RLE Deficits / Details: bears weight with flexed knees but minimally able to move the feet       Communication   Communication: (sleepy; little verbalization)  Cognition Arousal/Alertness: Lethargic Behavior During Therapy: (catatonic) Overall Cognitive Status: No family/caregiver present to determine baseline cognitive functioning  General Comments: h/o dementia. Follows most one step commands with inconsistency, catatonic.      General Comments      Exercises     Assessment/Plan    PT Assessment Patient needs continued PT services  PT Problem List Decreased activity tolerance;Decreased balance;Decreased mobility;Decreased cognition       PT Treatment Interventions Gait training;Functional mobility training;Therapeutic activities    PT Goals (Current goals can be found in the Care Plan section)  Acute Rehab PT Goals Patient Stated Goal: none  stated PT Goal Formulation: Patient unable to participate in goal setting Time For Goal Achievement: 06/02/18 Potential to Achieve Goals: Fair    Frequency Min 2X/week   Barriers to discharge        Co-evaluation PT/OT/SLP Co-Evaluation/Treatment: Yes Reason for Co-Treatment: For patient/therapist safety PT goals addressed during session: Mobility/safety with mobility OT goals addressed during session: ADL's and self-care       AM-PAC PT "6 Clicks" Daily Activity  Outcome Measure Difficulty turning over in bed (including adjusting bedclothes, sheets and blankets)?: Unable Difficulty moving from lying on back to sitting on the side of the bed? : Unable Difficulty sitting down on and standing up from a chair with arms (e.g., wheelchair, bedside commode, etc,.)?: Unable Help needed moving to and from a bed to chair (including a wheelchair)?: Total Help needed walking in hospital room?: Total Help needed climbing 3-5 steps with a railing? : Total 6 Click Score: 6    End of Session   Activity Tolerance: Patient limited by lethargy Patient left: in bed;with call bell/phone within reach;with nursing/sitter in room Nurse Communication: Mobility status PT Visit Diagnosis: Unsteadiness on feet (R26.81)    Time: 9702-6378 PT Time Calculation (min) (ACUTE ONLY): 16 min   Charges:   PT Evaluation $PT Eval Low Complexity: [place] Pager [phone] Office [phone]   Claretha Cooper 05/19/2018, 2:39 PM

## 2018-05-20 LAB — CULTURE, BLOOD (ROUTINE X 2)
Culture: NO GROWTH
Culture: NO GROWTH

## 2018-05-20 MED ORDER — HALOPERIDOL LACTATE 5 MG/ML IJ SOLN
2.0000 mg | Freq: Once | INTRAMUSCULAR | Status: AC | PRN
  Administered 2018-05-21: 2 mg via INTRAMUSCULAR
  Filled 2018-05-20: qty 1

## 2018-05-20 MED ORDER — POTASSIUM CHLORIDE 2 MEQ/ML IV SOLN
INTRAVENOUS | Status: DC
  Filled 2018-05-20: qty 1000

## 2018-05-20 MED ORDER — KCL IN DEXTROSE-NACL 10-5-0.45 MEQ/L-%-% IV SOLN
INTRAVENOUS | Status: DC
  Administered 2018-05-20 – 2018-05-29 (×8): via INTRAVENOUS
  Administered 2018-05-30: 1000 mL via INTRAVENOUS
  Administered 2018-06-01 – 2018-06-08 (×5): via INTRAVENOUS
  Filled 2018-05-20 (×32): qty 1000

## 2018-05-20 NOTE — Progress Notes (Signed)
Pt tearful and aggravated, stated she wants to go home. Pt is refusing her meals. Tried to talk ms Cobern into eating for about 20 min. Still refusing. Pt screaming when trying to get her to stop pulling on her gown and iv site.
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
## 2018-05-21 LAB — BASIC METABOLIC PANEL
ANION GAP: 10 (ref 5–15)
BUN: 8 mg/dL (ref 8–23)
CALCIUM: 8.7 mg/dL — AB (ref 8.9–10.3)
CHLORIDE: 108 mmol/L (ref 98–111)
CO2: 26 mmol/L (ref 22–32)
Creatinine, Ser: 0.64 mg/dL (ref 0.44–1.00)
GFR calc non Af Amer: >60 mL/min (ref >60)
Glucose, Bld: 88 mg/dL (ref 70–99)
Potassium: 5 mmol/L (ref 3.5–5.1)
Sodium: 144 mmol/L (ref 135–145)

## 2018-05-21 MED ORDER — HALOPERIDOL LACTATE 5 MG/ML IJ SOLN: 2.0000 mg | Freq: Once | INTRAMUSCULAR | Status: DC

## 2018-05-21 MED ORDER — OLANZAPINE 5 MG PO TABS
5.0000 mg | ORAL_TABLET | Freq: Two times a day (BID) | ORAL | Status: DC
  Administered 2018-05-21 – 2018-05-23 (×6): 5 mg via ORAL
  Filled 2018-05-21 (×6): qty 1

## 2018-05-21 NOTE — Care Management Important Message (Deleted)
Important Message  Patient Details  Name: Gina Edwards MRN: [id number] Date of Birth: 1941/07/27   Medicare Important Message Given:  Yes    Kerin Salen 05/21/2018, 11:14 AMImportant Message  Patient Details  Name: Gina Edwards MRN: [id number] Date of Birth: [birth year]   Medicare Important Message Given:  Yes    Kerin Salen 05/21/2018, 11:14 AM
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
## 2018-05-22 LAB — COMPREHENSIVE METABOLIC PANEL
ALT: 27 U/L (ref 0–44)
AST: 30 U/L (ref 15–41)
Albumin: 3 g/dL — ABNORMAL LOW (ref 3.5–5.0)
Alkaline Phosphatase: 99 U/L (ref 38–126)
Anion gap: 9 (ref 5–15)
BILIRUBIN TOTAL: 1 mg/dL (ref 0.3–1.2)
BUN: 9 mg/dL (ref 8–23)
CHLORIDE: 104 mmol/L (ref 98–111)
CO2: 28 mmol/L (ref 22–32)
Calcium: 8.4 mg/dL — ABNORMAL LOW (ref 8.9–10.3)
Creatinine, Ser: 0.55 mg/dL (ref 0.44–1.00)
Glucose, Bld: 125 mg/dL — ABNORMAL HIGH (ref 70–99)
Potassium: 4.1 mmol/L (ref 3.5–5.1)
Sodium: 141 mmol/L (ref 135–145)
TOTAL PROTEIN: 5.8 g/dL — AB (ref 6.5–8.1)

## 2018-05-22 NOTE — Progress Notes (Signed)
Patient was visited twice today and is sleeping comfortably I did not agitate for fear of agitation on behalf of the patient  Verneita Griffes, MD [facility] 1:45 PM
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
## 2018-05-24 MED ORDER — OLANZAPINE 5 MG PO TABS
2.5000 mg | ORAL_TABLET | Freq: Two times a day (BID) | ORAL | Status: DC
  Administered 2018-05-24 (×2): 2.5 mg via ORAL
  Filled 2018-05-24 (×2): qty 1

## 2018-05-24 MED ORDER — MAGNESIUM HYDROXIDE 400 MG/5ML PO SUSP
30.0000 mL | Freq: Once | ORAL | Status: AC
  Administered 2018-05-24: 30 mL via ORAL
  Filled 2018-05-24: qty 30
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
## 2018-05-24 NOTE — Care Management Important Message (Deleted)
Important Message  Patient Details  Name: JOANI COSMA MRN: [id number] Date of Birth: 05-Jan-1941   Medicare Important Message Given:  Yes    Kerin Salen 05/24/2018, 11:00 AMImportant Message  Patient Details  Name: EUGINA ROW MRN: [id number] Date of Birth: 08-13-1940   Medicare Important Message Given:  Yes    Kerin Salen 05/24/2018, 11:00 AM

## 2018-05-24 NOTE — Progress Notes (Signed)
CSW following to assist with disposition- confirmed with [facility] admissions this morning that pt remains on waiting list for geri-psych unit admission. Spoke with pt's son Gerald Stabs via phone with update. Advised psychiatric treatment remains recommendation at this point and that pt's needs will continue to be evaluated so that, while she remains on [facility] waiting list, if she were to stabilize prior to admission, appropriate disposition elsewhere could be pursued. Son very understanding. Pt's remains under IVC currently.  Sharren Bridge, MSW, LCSW Clinical Social Work 05/24/2018 [phone]

## 2018-05-24 NOTE — Progress Notes (Signed)
Physical Therapy Discharge Patient Details Name: Gina Edwards MRN: [id number] DOB: January 17, 1941 Today's Date: 05/24/2018 Time:  -     Patient discharged from PT services secondary to patient's current MS is not allowing patient to participate.Patient in restraints currently. .  Please see latest therapy progress note for current level of functioning and progress toward goals.    [facility] Pager [phone] Office [phone]  GP     Claretha Cooper 05/24/2018, 2:03 PM

## 2018-05-25 MED ORDER — OLANZAPINE 5 MG PO TABS
2.5000 mg | ORAL_TABLET | Freq: Every day | ORAL | Status: DC
  Administered 2018-05-25 – 2018-06-18 (×22): 2.5 mg via ORAL
  Filled 2018-05-25 (×24): qty 1

## 2018-05-25 MED ORDER — OLANZAPINE 5 MG PO TABS
5.0000 mg | ORAL_TABLET | Freq: Every day | ORAL | Status: DC
  Administered 2018-05-25 – 2018-06-17 (×22): 5 mg via ORAL
  Filled 2018-05-25 (×24): qty 1
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
## 2018-05-25 NOTE — Progress Notes (Signed)
Patient was IVC'd on 10/15 at 3:31PM- in the event patient remains in the hospital patient will need to have new paperwork completed in 7 days (10/22).   Kingsley Spittle, [place]  [phone]

## 2018-05-25 NOTE — Progress Notes (Signed)
Occupational Therapy Treatment Patient Details Name: Gina Edwards MRN: [id number] DOB: 06-25-1941 Today's Date: 05/25/2018    History of present illness Pt was admitted for dementia with behavioral disturbance.  PMH: anxiety and depression   OT comments  Pt followed many one step commands in context. Sat EOB and performed grooming and UE exercises.  Min A for bed mobility  Follow Up Recommendations  (geri pysch per notes)    Equipment Recommendations  3 in 1 bedside commode    Recommendations for Other Services      Precautions / Restrictions Precautions Precautions: Fall Precaution Comments: aggressive behavior at times including hitting, biting, spitting Restrictions Weight Bearing Restrictions: No       Mobility Bed Mobility         Supine to sit: Min assist Sit to supine: Min assist   General bed mobility comments: pt sat EOB x 5 minutes with min guard assist  Transfers                      Balance     Sitting balance-Leahy Scale: Fair                                     ADL either performed or assessed with clinical judgement   ADL                                         General ADL Comments: opened lotion and applied with min A.  Max A to comb hair.       Vision       Perception     Praxis      Cognition Arousal/Alertness: Awake/alert Behavior During Therapy: WFL for tasks assessed/performed Overall Cognitive Status: No family/caregiver present to determine baseline cognitive functioning                                 General Comments: h/o dementia.  Pt followed many simple one step commands in context        Exercises Exercises: Other exercises Other Exercises Other Exercises: A/AAROM to bil shoulders sitting EOB up to 80 degrees   Shoulder Instructions       General Comments      Pertinent Vitals/ Pain       Pain Assessment: No/denies pain  Home Living                                           Prior Functioning/Environment              Frequency  Min 2X/week        Progress Toward Goals  OT Goals(current goals can now be found in the care plan section)  Progress towards OT goals: Progressing toward goals     Plan      Co-evaluation                 AM-PAC PT "6 Clicks" Daily Activity     Outcome Measure   Help from another person eating meals?: Total Help from another person taking care of personal grooming?: A Lot Help from another person toileting, which  includes using toliet, bedpan, or urinal?: Total Help from another person bathing (including washing, rinsing, drying)?: Total Help from another person to put on and taking off regular upper body clothing?: Total Help from another person to put on and taking off regular lower body clothing?: Total 6 Click Score: 7    End of Session    OT Visit Diagnosis: Unsteadiness on feet (R26.81)   Activity Tolerance Patient tolerated treatment well   Patient Left in bed;with call bell/phone within reach;with nursing/sitter in room(sitter will watch pt without mitts)   Nurse Communication          Time: 3496-1164 OT Time Calculation (min): 14 min  Charges: OT General Charges $OT Visit: 1 Visit OT Treatments $Self Care/Home Management : 8-22 mins  Lesle Chris, OTR/L Acute Rehabilitation Services [phone] [facility] pager [phone] office 05/25/2018   [place] 05/25/2018, 2:42 PM

## 2018-05-25 NOTE — Progress Notes (Signed)
Nutrition Follow-up  INTERVENTION:   Continue Ensure Enlive po BID, each supplement provides 350 kcal and 20 grams of protein  NUTRITION DIAGNOSIS:   Inadequate oral intake related to (AMS, dementia) as evidenced by meal completion < 50%.  Ongoing.  GOAL:   Patient will meet greater than or equal to 90% of their needs  Progressing.  MONITOR:   PO intake, Supplement acceptance, Labs, Weight trends, I & O's  ASSESSMENT:   77 y.o. female with a history of dementia, anxiety, and depression who presented from home with agitation and progressively worsening aggressive behavior  Patient continues aggressive behavior, has mitts on. Pt consumed 25-75% of meals 10/14. Not drinking Ensure supplements consistently.  No weight recorded, most likely d/t mental status.  Medications: Vitamin D tablet daily, D5 and .45% NaCl w/ KCl infusion at 50 ml/hr Labs reviewed: Last collected 10/12.  Diet Order:   Diet Order            Diet Heart Room service appropriate? Yes; Fluid consistency: Thin  Diet effective now              EDUCATION NEEDS:   Not appropriate for education at this time  Skin:  Skin Assessment: Reviewed RN Assessment  Last BM:  PTA  Height:   Ht Readings from Last 1 Encounters:  02/06/18 5\' 6"  (1.676 m)    Weight:   Wt Readings from Last 1 Encounters:  02/06/18 64.4 kg    Ideal Body Weight:  (No height measurement in chart)  BMI:  There is no height or weight on file to calculate BMI.  Estimated Nutritional Needs:   Kcal:  unable to calculate  Protein:     Fluid:     Clayton Bibles, MS, RD, LDN [place] [facility] Pager: [phone] After Hours Pager: [phone]

## 2018-05-25 NOTE — Consult Note (Signed)
[facility] Psych Consult Progress Note  05/25/2018 11:42 AM Gina Edwards  MRN:  [id number] Subjective:   [name] was last seen on 10/8 by the psychiatry consult service. Zyprexa and Depakote were started while Abilify and Seroquel were discontinued. She continues to have intermittent agitation requiring intermittent restraints.   On interview, [name] reports that her mood is "fine." She is mostly nonsensical in thought process and difficult to comprehend. She is only oriented to person. She denies problems with her appetite or sleep although she continues to have a poor appetite per nursing. She denies SI, HI or AVH.   Principal Problem: Dementia with behavioral disturbance ([place]) Diagnosis:   Patient Active Problem List   Diagnosis Date Noted  . Evaluation by psychiatric service required [Z00.8]   . Acute lower UTI [N39.0] 05/15/2018  . Sinusitis [J32.9] 05/15/2018  . UTI (urinary tract infection) [N39.0] 05/15/2018  . Pansinusitis [J32.4] 05/15/2018  . Aggressive behavior [R46.89]   . Dementia with behavioral disturbance ([place]) [F03.91] 12/02/2012  . Depression [F32.9] 12/02/2012  . Anxiety [F41.9] 12/02/2012  . H/O meningioma of the brain [Z86.011] 12/02/2012   Total Time spent with patient: 30 minutes  Past Psychiatric History: Dementia, depression and anxiety.   Past Medical History:  Past Medical History:  Diagnosis Date  . Anxiety 12/02/2012  . Anxiety and depression   . Dementia ([place]) 12/02/2012  . Depression 12/02/2012  . H/O meningioma of the brain 12/02/2012  . SUI (stress urinary incontinence, female)     Past Surgical History:  Procedure Laterality Date  . APPENDECTOMY  1957  . TUBAL LIGATION Bilateral 1975   Family History:  Family History  Problem Relation Age of Onset  . Heart disease Mother   . Colon cancer Mother 67  . Prostate cancer Father    Family Psychiatric  History: Unknown  Social History:  Social History   Substance and Sexual Activity   Alcohol Use No     Social History   Substance and Sexual Activity  Drug Use No    Social History   Socioeconomic History  . Marital status: Married    Spouse name: Not on file  . Number of children: Not on file  . Years of education: Not on file  . Highest education level: Not on file  Occupational History  . Not on file  Social Needs  . Financial resource strain: Not on file  . Food insecurity:    Worry: Not on file    Inability: Not on file  . Transportation needs:    Medical: Not on file    Non-medical: Not on file  Tobacco Use  . Smoking status: Never Smoker  . Smokeless tobacco: Never Used  Substance and Sexual Activity  . Alcohol use: No  . Drug use: No  . Sexual activity: Yes    Partners: Male    Birth control/protection: Post-menopausal  Lifestyle  . Physical activity:    Days per week: Not on file    Minutes per session: Not on file  . Stress: Not on file  Relationships  . Social connections:    Talks on phone: Not on file    Gets together: Not on file    Attends religious service: Not on file    Active member of club or organization: Not on file    Attends meetings of clubs or organizations: Not on file    Relationship status: Not on file  Other Topics Concern  . Not on file  Social History Narrative   Pt live at home with spouse.    Caffeine Use- 24oz    Sleep: Fair  Appetite:  Poor  Current Medications: Current Facility-Administered Medications  Medication Dose Route Frequency Provider Last Rate Last Dose  . acetaminophen (TYLENOL) tablet 650 mg  650 mg Oral Q6H PRN Toy Baker, MD       Or  . acetaminophen (TYLENOL) suppository 650 mg  650 mg Rectal Q6H PRN Doutova, Anastassia, MD      . cholecalciferol (VITAMIN D) tablet 2,000 Units  2,000 Units Oral Daily Toy Baker, MD   2,000 Units at 05/24/18 1039  . clonazePAM (KLONOPIN) tablet 0.5 mg  0.5 mg Oral Daily Doutova, Anastassia, MD   0.5 mg at 05/24/18 1019  . dextrose  5 % and 0.45 % NaCl with KCl 10 mEq/L infusion   Intravenous Continuous Nita Sells, MD 50 mL/hr at 05/25/18 0451    . divalproex (DEPAKOTE SPRINKLE) capsule 250 mg  250 mg Oral Q12H Patrecia Pour, MD   250 mg at 05/24/18 2140  . enoxaparin (LOVENOX) injection 40 mg  40 mg Subcutaneous Daily Doutova, Anastassia, MD   40 mg at 05/24/18 1039  . escitalopram (LEXAPRO) tablet 10 mg  10 mg Oral Daily Doutova, Anastassia, MD   10 mg at 05/24/18 1019  . feeding supplement (ENSURE ENLIVE) (ENSURE ENLIVE) liquid 237 mL  237 mL Oral BID BM Patrecia Pour, MD   237 mL at 05/23/18 1146  . HYDROcodone-acetaminophen (NORCO/VICODIN) 5-325 MG per tablet 1-2 tablet  1-2 tablet Oral Q4H PRN Toy Baker, MD   1 tablet at 05/17/18 1050  . LORazepam (ATIVAN) injection 1 mg  1 mg Intravenous Q6H PRN Patrecia Pour, MD   1 mg at 05/21/18 1207  . LORazepam (ATIVAN) tablet 1 mg  1 mg Oral Q6H PRN Patrecia Pour, MD   1 mg at 05/20/18 1904  . MEDLINE mouth rinse  15 mL Mouth Rinse BID Toy Baker, MD   15 mL at 05/23/18 2227  . OLANZapine (ZYPREXA) tablet 2.5 mg  2.5 mg Oral Daily Nita Sells, MD       And  . OLANZapine (ZYPREXA) tablet 5 mg  5 mg Oral QHS Samtani, Jai-Gurmukh, MD      . ondansetron (ZOFRAN) tablet 4 mg  4 mg Oral Q6H PRN Doutova, Anastassia, MD       Or  . ondansetron (ZOFRAN) injection 4 mg  4 mg Intravenous Q6H PRN Doutova, Anastassia, MD      . polyethylene glycol (MIRALAX / GLYCOLAX) packet 17 g  17 g Oral Daily PRN Doutova, Anastassia, MD   17 g at 05/17/18 1050    Lab Results: No results found for this or any previous visit (from the past 48 hour(s)).  Blood Alcohol level:  Lab Results  Component Value Date   ETH <10 05/12/2018     Musculoskeletal: Strength & Muscle Tone: Generalized weakness. Gait & Station: unable to stand Patient leans: N/A  Psychiatric Specialty Exam: Physical Exam  Nursing note and vitals reviewed. Constitutional: She is oriented  to person, place, and time. She appears well-developed and well-nourished.  HENT:  Head: Normocephalic and atraumatic.  Neck: Normal range of motion.  Respiratory: Effort normal.  Musculoskeletal: Normal range of motion.  Neurological: She is alert and oriented to person, place, and time.  Psychiatric: She has a normal mood and affect. Her speech is normal and behavior is normal. Thought content normal. Cognition and memory  are impaired. She expresses impulsivity.    Review of Systems  Psychiatric/Behavioral: Negative for hallucinations, substance abuse and suicidal ideas. The patient does not have insomnia.   All other systems reviewed and are negative.   Blood pressure (!) 157/80, pulse 77, temperature 97.8 F (36.6 C), temperature source Oral, resp. rate 16, SpO2 100 %.There is no height or weight on file to calculate BMI.  General Appearance: Fairly Groomed, elderly, Caucasian female, wearing a hospital gown with hand mitts who is lying in bed. NAD.   Eye Contact:  Good  Speech:  Normal Rate and Slurred  Volume:  Decreased  Mood:  Euthymic  Affect:  Constricted  Thought Process:  Disorganized and Descriptions of Associations: Tangential  Orientation:  Other:  Only oriented to self.  Thought Content:  Illogical  Suicidal Thoughts:  No  Homicidal Thoughts:  No  Memory:  Immediate;   Poor Recent;   Poor Remote;   Poor  Judgement:  Impaired  Insight:  Lacking  Psychomotor Activity:  Normal  Concentration:  Concentration: Fair and Attention Span: Fair  Recall:  Poor  Fund of Knowledge:  Poor  Language:  Fair  Akathisia:  No  Handed:  Right  AIMS (if indicated):   N/A  Assets:  Housing Social Support  ADL's:  Impaired  Cognition:  Impaired with short and long term memory deficits.   Sleep:   Okay   Assessment:  Gina Edwards is a 77 y.o. female who was admitted with agitation in the setting of dementia. She continues to have intermittent agitation requiring restraints. She  continues to warrant inpatient psychiatric hospitalization for stabilization and treatment.    Treatment Plan Summary: -Patient warrants inpatient psychiatric hospitalization given high risk of harm to self/agitation. -Continue bedside sitter.  -Continue Zyprexa 2.5 mg q am and 5 mg qhs for agitation and poor appetite (increased from 2.5 mg BID today).  -Continue Depakote 250 mg BID for mood stabilization/agitation. -Continue Lexapro 10 mg daily for mood.  -Continue Klonopin 0.5 mg daily for agitation.  -EKG reviewed and QTc 462 on 10/5. Please closely monitor when starting or increasing QTc prolonging agents.  -Recommend repeating EKG with recent increase in Zyprexa.  -Patient is IVC'd and is unable to leave the hospital.  -Will sign off on patient at this time. Please consult psychiatry again as needed.     Faythe Dingwall, DO 05/25/2018, 11:42 AM

## 2018-05-26 NOTE — Progress Notes (Signed)
Pt received psychiatric evaluation update yesterday 05/25/18 and inpatient treatment continues to be recommended. Pt remains on admission wait list for [facility]- denied by other geriatric behavioral hospitals.  Updated pt's son Gerald Stabs via phone [phone]. He expresses concern that, since long term plan is to have pt admit to an ALF once behaviorally stable, his impending deployment may prevent him from being directly involved in disposition decisions in coming weeks. Validated son's concern- son notes his wife (pt's daughter-in-law [phone]) will be available to assist, and [place] following as well [facility] [phone]). Pt received renewed IVC yesterday 05/25/18- valid until 06/01/18. In the event pt needs inpatient treatment still at that point, will need to be renewed that day.  Sharren Bridge, MSW, LCSW Clinical Social Work 05/26/2018 [phone]

## 2018-05-26 NOTE — Progress Notes (Signed)
PROGRESS NOTE    Gina Edwards  TDV:[id number] DOB: Oct 16, 1940 DOA: 05/12/2018 PCP: Shon Baton, MD   Brief Narrative: Gina Edwards is a 77 y.o. female with a history of dementia, bipolar disorder that presented secondary to agitation and aggressive behavior. Plan for inpatient behavioral health admission.   Assessment & Plan:   Principal Problem:   Dementia with behavioral disturbance ([place]) Active Problems:   Aggressive behavior   Acute lower UTI   Sinusitis   UTI (urinary tract infection)   Pansinusitis   Evaluation by psychiatric service required   Dementia with behavioral disturbance -Psychiatry recommendations: inpatient behavioral admission -Continue Zyprexa, depakote, lexapro, ativan prn  Klebsiella UTI Treated with ceftriaxone/Keflex. Completed course.  Poor oral intake In setting of acute psychosis. -Encourage oral intake -IV fluids  Decreased urine output Resolved.  Pansinusitis Concern for chronic sinusitis vs fungal ball. Recommendation for ENT follow-up   DVT prophylaxis: Lovenox Code Status:   Code Status: DNR Family Communication: Son at bedside Disposition Plan: Discharge to inpatient [facility] when bed available. Medical stable.   Consultants:   Psychiatry  Procedures:   None  Antimicrobials:  Keflex (10/2>>10/4; 10/7>>10/11)  Ceftriaxone (10/5>>10/7)    Subjective: Confused. Not coherent.  Objective: Vitals:   05/25/18 1432 05/25/18 2227 05/26/18 0500 05/26/18 1417  BP: (!) 140/96 (!) 167/93 122/69 116/70  Pulse: 100 98 90 96  Resp: 16 16 14 16   Temp: 97.9 F (36.6 C) (!) 97.3 F (36.3 C) 97.9 F (36.6 C) 97.6 F (36.4 C)  TempSrc: Oral Oral Axillary Oral  SpO2: 99% 96% 98% 99%    Intake/Output Summary (Last 24 hours) at 05/26/2018 1738 Last data filed at 05/26/2018 1330 Gross per 24 hour  Intake 120 ml  Output 300 ml  Net -180 ml   There were no vitals filed for this visit.  Examination:  General exam: Appears  calm and comfortable Respiratory system: Clear to auscultation. Respiratory effort normal. Cardiovascular system: S1 & S2 heard, RRR. No murmurs, rubs, gallops or clicks. Gastrointestinal system: Abdomen is nondistended, soft and nontender. No organomegaly or masses felt. Normal bowel sounds heard. Central nervous system: Alert. Follows commands. Extremities: No edema. No calf tenderness Skin: No cyanosis. No rashes Psychiatry: Flat affect, appears to have delusions.    Data Reviewed: I have personally reviewed following labs and imaging studies  CBC: No results for input(s): WBC, NEUTROABS, HGB, HCT, MCV, PLT in the last 168 hours. Basic Metabolic Panel: Recent Labs  Lab 05/21/18 0404 05/22/18 0552  NA 144 141  K 5.0 4.1  CL 108 104  CO2 26 28  GLUCOSE 88 125*  BUN 8 9  CREATININE 0.64 0.55  CALCIUM 8.7* 8.4*   GFR: CrCl cannot be calculated (Unknown ideal weight.). Liver Function Tests: Recent Labs  Lab 05/22/18 0552  AST 30  ALT 27  ALKPHOS 99  BILITOT 1.0  PROT 5.8*  ALBUMIN 3.0*   No results for input(s): LIPASE, AMYLASE in the last 168 hours. No results for input(s): AMMONIA in the last 168 hours. Coagulation Profile: No results for input(s): INR, PROTIME in the last 168 hours. Cardiac Enzymes: No results for input(s): CKTOTAL, CKMB, CKMBINDEX, TROPONINI in the last 168 hours. BNP (last 3 results) No results for input(s): PROBNP in the last 8760 hours. HbA1C: No results for input(s): HGBA1C in the last 72 hours. CBG: No results for input(s): GLUCAP in the last 168 hours. Lipid Profile: No results for input(s): CHOL, HDL, LDLCALC, TRIG, CHOLHDL, LDLDIRECT in the  last 72 hours. Thyroid Function Tests: No results for input(s): TSH, T4TOTAL, FREET4, T3FREE, THYROIDAB in the last 72 hours. Anemia Panel: No results for input(s): VITAMINB12, FOLATE, FERRITIN, TIBC, IRON, RETICCTPCT in the last 72 hours. Sepsis Labs: No results for input(s): PROCALCITON,  LATICACIDVEN in the last 168 hours.  No results found for this or any previous visit (from the past 240 hour(s)).       Radiology Studies: No results found.      Scheduled Meds: . cholecalciferol  2,000 Units Oral Daily  . clonazePAM  0.5 mg Oral Daily  . divalproex  250 mg Oral Q12H  . enoxaparin (LOVENOX) injection  40 mg Subcutaneous Daily  . escitalopram  10 mg Oral Daily  . feeding supplement (ENSURE ENLIVE)  237 mL Oral BID BM  . mouth rinse  15 mL Mouth Rinse BID  . OLANZapine  2.5 mg Oral Daily   And  . OLANZapine  5 mg Oral QHS   Continuous Infusions: . dextrose 5 % and 0.45 % NaCl with KCl 10 mEq/L 50 mL/hr at 05/25/18 0451     LOS: 8 days     Cordelia Poche, MD [facility] 05/26/2018, 5:38 PM  If 7PM-7AM, please contact night-coverage [email or web address]

## 2018-05-27 NOTE — Care Management Important Message (Signed)
Important Message  Patient Details  Name: [name] MRN: [id number] Date of Birth: March 07, 1941   Medicare Important Message Given:  Yes    Kerin Salen 05/27/2018, 10:57 AMImportant Message  Patient Details  Name: [name] MRN: [id number] Date of Birth: 03/13/1941   Medicare Important Message Given:  Yes    Kerin Salen 05/27/2018, 10:54 AM

## 2018-05-27 NOTE — Progress Notes (Signed)
PROGRESS NOTE    Gina Edwards  ZJQ:[id number] DOB: 1941/07/07 DOA: 05/12/2018 PCP: Shon Baton, MD   Brief Narrative: Gina Edwards is a 77 y.o. female with a history of dementia, bipolar disorder that presented secondary to agitation and aggressive behavior. Plan for inpatient behavioral health admission.   Assessment & Plan:   Principal Problem:   Dementia with behavioral disturbance ([place]) Active Problems:   Aggressive behavior   Acute lower UTI   Sinusitis   UTI (urinary tract infection)   Pansinusitis   Evaluation by psychiatric service required   Dementia with behavioral disturbance -Psychiatry recommendations: inpatient behavioral admission -Continue Zyprexa, depakote, lexapro, ativan prn  Klebsiella UTI Treated with ceftriaxone/Keflex. Completed course.  Poor oral intake In setting of acute psychosis. -Encourage oral intake -IV fluids  Decreased urine output Resolved.  Pansinusitis Concern for chronic sinusitis vs fungal ball. Recommendation for ENT follow-up   DVT prophylaxis: Lovenox Code Status:   Code Status: DNR Family Communication: Son at bedside Disposition Plan: Discharge to inpatient [facility] when bed available. Medical stable.   Consultants:   Psychiatry  Procedures:   None  Antimicrobials:  Keflex (10/2>>10/4; 10/7>>10/11)  Ceftriaxone (10/5>>10/7)    Subjective: No issues.  Objective: Vitals:   05/26/18 0500 05/26/18 1417 05/26/18 2001 05/27/18 0602  BP: 122/69 116/70 (!) 123/55 119/74  Pulse: 90 96 62 69  Resp: 14 16 17 16   Temp: 97.9 F (36.6 C) 97.6 F (36.4 C) 97.6 F (36.4 C) 98 F (36.7 C)  TempSrc: Axillary Oral Oral Oral  SpO2: 98% 99%  98%    Intake/Output Summary (Last 24 hours) at 05/27/2018 1546 Last data filed at 05/27/2018 1246 Gross per 24 hour  Intake 480 ml  Output 400 ml  Net 80 ml   There were no vitals filed for this visit.  Examination:  General exam: Appears calm and comfortable     Data Reviewed: I have personally reviewed following labs and imaging studies  CBC: No results for input(s): WBC, NEUTROABS, HGB, HCT, MCV, PLT in the last 168 hours. Basic Metabolic Panel: Recent Labs  Lab 05/21/18 0404 05/22/18 0552  NA 144 141  K 5.0 4.1  CL 108 104  CO2 26 28  GLUCOSE 88 125*  BUN 8 9  CREATININE 0.64 0.55  CALCIUM 8.7* 8.4*   GFR: CrCl cannot be calculated (Unknown ideal weight.). Liver Function Tests: Recent Labs  Lab 05/22/18 0552  AST 30  ALT 27  ALKPHOS 99  BILITOT 1.0  PROT 5.8*  ALBUMIN 3.0*   No results for input(s): LIPASE, AMYLASE in the last 168 hours. No results for input(s): AMMONIA in the last 168 hours. Coagulation Profile: No results for input(s): INR, PROTIME in the last 168 hours. Cardiac Enzymes: No results for input(s): CKTOTAL, CKMB, CKMBINDEX, TROPONINI in the last 168 hours. BNP (last 3 results) No results for input(s): PROBNP in the last 8760 hours. HbA1C: No results for input(s): HGBA1C in the last 72 hours. CBG: No results for input(s): GLUCAP in the last 168 hours. Lipid Profile: No results for input(s): CHOL, HDL, LDLCALC, TRIG, CHOLHDL, LDLDIRECT in the last 72 hours. Thyroid Function Tests: No results for input(s): TSH, T4TOTAL, FREET4, T3FREE, THYROIDAB in the last 72 hours. Anemia Panel: No results for input(s): VITAMINB12, FOLATE, FERRITIN, TIBC, IRON, RETICCTPCT in the last 72 hours. Sepsis Labs: No results for input(s): PROCALCITON, LATICACIDVEN in the last 168 hours.  No results found for this or any previous visit (from the past 240 hour(s)).  Radiology Studies: No results found.      Scheduled Meds: . cholecalciferol  2,000 Units Oral Daily  . clonazePAM  0.5 mg Oral Daily  . divalproex  250 mg Oral Q12H  . enoxaparin (LOVENOX) injection  40 mg Subcutaneous Daily  . escitalopram  10 mg Oral Daily  . feeding supplement (ENSURE ENLIVE)  237 mL Oral BID BM  . mouth rinse  15 mL  Mouth Rinse BID  . OLANZapine  2.5 mg Oral Daily   And  . OLANZapine  5 mg Oral QHS   Continuous Infusions: . dextrose 5 % and 0.45 % NaCl with KCl 10 mEq/L 50 mL/hr at 05/25/18 0451     LOS: 9 days     Cordelia Poche, MD [facility] 05/27/2018, 3:46 PM  If 7PM-7AM, please contact night-coverage [email or web address]

## 2018-05-27 NOTE — Care Management Important Message (Deleted)
Important Message  Patient Details  Name: Gina Edwards MRN: [id number] Date of Birth: 22-Aug-1940   Medicare Important Message Given:  Yes    Kerin Salen 05/27/2018, 10:39 AMImportant Message  Patient Details  Name: Gina Edwards MRN: [id number] Date of Birth: Jul 25, 1941   Medicare Important Message Given:  Yes    Kerin Salen 05/27/2018, 10:39 AM

## 2018-05-28 NOTE — Progress Notes (Signed)
PROGRESS NOTE    Gina Edwards  FTD:[id number] DOB: 14-Apr-1941 DOA: 05/12/2018 PCP: Shon Baton, MD   Brief Narrative: Gina Edwards is a 77 y.o. female with a history of dementia, bipolar disorder that presented secondary to agitation and aggressive behavior. Plan for inpatient behavioral health admission.   Assessment & Plan:   Principal Problem:   Dementia with behavioral disturbance ([place]) Active Problems:   Aggressive behavior   Acute lower UTI   Sinusitis   UTI (urinary tract infection)   Pansinusitis   Evaluation by psychiatric service required   Dementia with behavioral disturbance Patient is much less combative.  -Psychiatry recommendations: inpatient behavioral admission -Continue Zyprexa, depakote, lexapro, ativan prn  Klebsiella UTI Treated with ceftriaxone/Keflex. Completed course.  Poor oral intake In setting of acute psychosis. -Encourage oral intake -IV fluids  Decreased urine output Resolved.  Pansinusitis Concern for chronic sinusitis vs fungal ball. Recommendation for ENT follow-up   DVT prophylaxis: Lovenox Code Status:   Code Status: DNR Family Communication: Son at bedside Disposition Plan: Discharge to inpatient [facility] when bed available. Medical stable.   Consultants:   Psychiatry  Procedures:   None  Antimicrobials:  Keflex (10/2>>10/4; 10/7>>10/11)  Ceftriaxone (10/5>>10/7)    Subjective: No problems overnight  Objective: Vitals:   05/26/18 2001 05/27/18 0602 05/27/18 2040 05/28/18 0640  BP: (!) 123/55 119/74 131/66 (!) 155/79  Pulse: 62 69 96 70  Resp: 17 16 18    Temp: 97.6 F (36.4 C) 98 F (36.7 C) 97.6 F (36.4 C) 97.6 F (36.4 C)  TempSrc: Oral Oral Axillary Oral  SpO2:  98% 98% 100%    Intake/Output Summary (Last 24 hours) at 05/28/2018 1210 Last data filed at 05/28/2018 0636 Gross per 24 hour  Intake 3989.29 ml  Output 825 ml  Net 3164.29 ml   There were no vitals filed for this  visit.  Examination:  General: Well appearing, no distress   Data Reviewed: I have personally reviewed following labs and imaging studies  CBC: No results for input(s): WBC, NEUTROABS, HGB, HCT, MCV, PLT in the last 168 hours. Basic Metabolic Panel: Recent Labs  Lab 05/22/18 0552  NA 141  K 4.1  CL 104  CO2 28  GLUCOSE 125*  BUN 9  CREATININE 0.55  CALCIUM 8.4*   GFR: CrCl cannot be calculated (Unknown ideal weight.). Liver Function Tests: Recent Labs  Lab 05/22/18 0552  AST 30  ALT 27  ALKPHOS 99  BILITOT 1.0  PROT 5.8*  ALBUMIN 3.0*   No results for input(s): LIPASE, AMYLASE in the last 168 hours. No results for input(s): AMMONIA in the last 168 hours. Coagulation Profile: No results for input(s): INR, PROTIME in the last 168 hours. Cardiac Enzymes: No results for input(s): CKTOTAL, CKMB, CKMBINDEX, TROPONINI in the last 168 hours. BNP (last 3 results) No results for input(s): PROBNP in the last 8760 hours. HbA1C: No results for input(s): HGBA1C in the last 72 hours. CBG: No results for input(s): GLUCAP in the last 168 hours. Lipid Profile: No results for input(s): CHOL, HDL, LDLCALC, TRIG, CHOLHDL, LDLDIRECT in the last 72 hours. Thyroid Function Tests: No results for input(s): TSH, T4TOTAL, FREET4, T3FREE, THYROIDAB in the last 72 hours. Anemia Panel: No results for input(s): VITAMINB12, FOLATE, FERRITIN, TIBC, IRON, RETICCTPCT in the last 72 hours. Sepsis Labs: No results for input(s): PROCALCITON, LATICACIDVEN in the last 168 hours.  No results found for this or any previous visit (from the past 240 hour(s)).  Radiology Studies: No results found.      Scheduled Meds: . cholecalciferol  2,000 Units Oral Daily  . clonazePAM  0.5 mg Oral Daily  . divalproex  250 mg Oral Q12H  . enoxaparin (LOVENOX) injection  40 mg Subcutaneous Daily  . escitalopram  10 mg Oral Daily  . feeding supplement (ENSURE ENLIVE)  237 mL Oral BID BM  .  mouth rinse  15 mL Mouth Rinse BID  . OLANZapine  2.5 mg Oral Daily   And  . OLANZapine  5 mg Oral QHS   Continuous Infusions: . dextrose 5 % and 0.45 % NaCl with KCl 10 mEq/L 50 mL/hr at 05/28/18 0636     LOS: 10 days     Cordelia Poche, MD [facility] 05/28/2018, 12:10 PM  If 7PM-7AM, please contact night-coverage [email or web address]

## 2018-05-29 NOTE — Progress Notes (Signed)
PROGRESS NOTE    Gina Edwards  JJH:[id number] DOB: 1941-06-28 DOA: 05/12/2018 PCP: Shon Baton, MD   Brief Narrative: Gina Edwards is a 77 y.o. female with a history of dementia, bipolar disorder that presented secondary to agitation and aggressive behavior. Plan for inpatient behavioral health admission.   Assessment & Plan:   Principal Problem:   Dementia with behavioral disturbance ([place]) Active Problems:   Aggressive behavior   Acute lower UTI   Sinusitis   UTI (urinary tract infection)   Pansinusitis   Evaluation by psychiatric service required   Dementia with behavioral disturbance Patient is much less combative.  -Psychiatry recommendations: inpatient behavioral admission -Continue Zyprexa, depakote, lexapro, ativan prn -Will re-consult psychiatry in 48 hours  Klebsiella UTI Treated with ceftriaxone/Keflex. Completed course.  Poor oral intake In setting of acute psychosis. Improved slightly. -Encourage oral intake -IV fluids -Daily weight  Decreased urine output Resolved.  Pansinusitis Concern for chronic sinusitis vs fungal ball. Recommendation for ENT follow-up   DVT prophylaxis: Lovenox Code Status:   Code Status: DNR Family Communication: Son at bedside Disposition Plan: Discharge to inpatient [facility] when bed available. Medical stable.   Consultants:   Psychiatry  Procedures:   None  Antimicrobials:  Keflex (10/2>>10/4; 10/7>>10/11)  Ceftriaxone (10/5>>10/7)    Subjective: No issues overnight.  Objective: Vitals:   05/27/18 0602 05/27/18 2040 05/28/18 0640 05/28/18 1345  BP: 119/74 131/66 (!) 155/79 (!) 165/78  Pulse: 69 96 70 94  Resp: 16 18  18   Temp: 98 F (36.7 C) 97.6 F (36.4 C) 97.6 F (36.4 C) 97.8 F (36.6 C)  TempSrc: Oral Axillary Oral Oral  SpO2: 98% 98% 100% 100%    Intake/Output Summary (Last 24 hours) at 05/29/2018 0959 Last data filed at 05/29/2018 0600 Gross per 24 hour  Intake 1157.93 ml  Output 650 ml   Net 507.93 ml   There were no vitals filed for this visit.  Examination:  General: Well appearing, no distress   Data Reviewed: I have personally reviewed following labs and imaging studies  CBC: No results for input(s): WBC, NEUTROABS, HGB, HCT, MCV, PLT in the last 168 hours. Basic Metabolic Panel: No results for input(s): NA, K, CL, CO2, GLUCOSE, BUN, CREATININE, CALCIUM, MG, PHOS in the last 168 hours. GFR: CrCl cannot be calculated (Unknown ideal weight.). Liver Function Tests: No results for input(s): AST, ALT, ALKPHOS, BILITOT, PROT, ALBUMIN in the last 168 hours. No results for input(s): LIPASE, AMYLASE in the last 168 hours. No results for input(s): AMMONIA in the last 168 hours. Coagulation Profile: No results for input(s): INR, PROTIME in the last 168 hours. Cardiac Enzymes: No results for input(s): CKTOTAL, CKMB, CKMBINDEX, TROPONINI in the last 168 hours. BNP (last 3 results) No results for input(s): PROBNP in the last 8760 hours. HbA1C: No results for input(s): HGBA1C in the last 72 hours. CBG: No results for input(s): GLUCAP in the last 168 hours. Lipid Profile: No results for input(s): CHOL, HDL, LDLCALC, TRIG, CHOLHDL, LDLDIRECT in the last 72 hours. Thyroid Function Tests: No results for input(s): TSH, T4TOTAL, FREET4, T3FREE, THYROIDAB in the last 72 hours. Anemia Panel: No results for input(s): VITAMINB12, FOLATE, FERRITIN, TIBC, IRON, RETICCTPCT in the last 72 hours. Sepsis Labs: No results for input(s): PROCALCITON, LATICACIDVEN in the last 168 hours.  No results found for this or any previous visit (from the past 240 hour(s)).       Radiology Studies: No results found.      Scheduled Meds: .  cholecalciferol  2,000 Units Oral Daily  . clonazePAM  0.5 mg Oral Daily  . divalproex  250 mg Oral Q12H  . enoxaparin (LOVENOX) injection  40 mg Subcutaneous Daily  . escitalopram  10 mg Oral Daily  . feeding supplement (ENSURE ENLIVE)  237 mL  Oral BID BM  . mouth rinse  15 mL Mouth Rinse BID  . OLANZapine  2.5 mg Oral Daily   And  . OLANZapine  5 mg Oral QHS   Continuous Infusions: . dextrose 5 % and 0.45 % NaCl with KCl 10 mEq/L 50 mL/hr at 05/29/18 0600     LOS: 11 days     Cordelia Poche, MD [facility] 05/29/2018, 9:59 AM  If 7PM-7AM, please contact night-coverage [email or web address]

## 2018-05-30 NOTE — Progress Notes (Signed)
PROGRESS NOTE    GLANDA SPANBAUER  SWN:[id number] DOB: [birth year] DOA: 05/12/2018 PCP: Shon Baton, MD   Brief Narrative: Gina Edwards is a 77 y.o. female with a history of dementia, bipolar disorder that presented secondary to agitation and aggressive behavior. Plan for inpatient behavioral health admission.   Assessment & Plan:   Principal Problem:   Dementia with behavioral disturbance ([facility]) Active Problems:   Aggressive behavior   Acute lower UTI   Sinusitis   UTI (urinary tract infection)   Pansinusitis   Evaluation by psychiatric service required   Dementia with behavioral disturbance Continues to be less combative. Required ativan. -Psychiatry recommendations: inpatient behavioral admission -Continue Zyprexa, depakote, lexapro, ativan prn -Will re-consult psychiatry in 48 hours  Klebsiella UTI Treated with ceftriaxone/Keflex. Completed course.  Poor oral intake In setting of acute psychosis. Improved slightly. -Encourage oral intake -IV fluids -Daily weight  Decreased urine output Resolved.  Pansinusitis Concern for chronic sinusitis vs fungal ball. Recommendation for ENT follow-up   DVT prophylaxis: Lovenox Code Status:   Code Status: DNR Family Communication: Son at bedside Disposition Plan: Discharge to inpatient [facility] when bed available. Medical stable.   Consultants:   Psychiatry  Procedures:   None  Antimicrobials:  Keflex (10/2>>10/4; 10/7>>10/11)  Ceftriaxone (10/5>>10/7)    Subjective: Required ativan overnight. No reports of combativeness.  Objective: Vitals:   05/28/18 1345 05/29/18 1136 05/29/18 1838 05/30/18 0614  BP: (!) 165/78  (!) 143/87 (!) 147/68  Pulse: 94  98 72  Resp: 18  18 16   Temp: 97.8 F (36.6 C)  98.2 F (36.8 C) 98.4 F (36.9 C)  TempSrc: Oral  Oral Oral  SpO2: 100%  (!) 85% 96%  Weight:  54.6 kg      Intake/Output Summary (Last 24 hours) at 05/30/2018 1040 Last data filed at 05/30/2018 0030 Gross  per 24 hour  Intake 671.58 ml  Output 850 ml  Net -178.42 ml   Filed Weights   05/29/18 1136  Weight: 54.6 kg    Examination:  General: Well appearing, no distress   Data Reviewed: I have personally reviewed following labs and imaging studies  CBC: No results for input(s): WBC, NEUTROABS, HGB, HCT, MCV, PLT in the last 168 hours. Basic Metabolic Panel: No results for input(s): NA, K, CL, CO2, GLUCOSE, BUN, CREATININE, CALCIUM, MG, PHOS in the last 168 hours. GFR: Estimated Creatinine Clearance: 50 mL/min (by C-G formula based on SCr of 0.55 mg/dL). Liver Function Tests: No results for input(s): AST, ALT, ALKPHOS, BILITOT, PROT, ALBUMIN in the last 168 hours. No results for input(s): LIPASE, AMYLASE in the last 168 hours. No results for input(s): AMMONIA in the last 168 hours. Coagulation Profile: No results for input(s): INR, PROTIME in the last 168 hours. Cardiac Enzymes: No results for input(s): CKTOTAL, CKMB, CKMBINDEX, TROPONINI in the last 168 hours. BNP (last 3 results) No results for input(s): PROBNP in the last 8760 hours. HbA1C: No results for input(s): HGBA1C in the last 72 hours. CBG: No results for input(s): GLUCAP in the last 168 hours. Lipid Profile: No results for input(s): CHOL, HDL, LDLCALC, TRIG, CHOLHDL, LDLDIRECT in the last 72 hours. Thyroid Function Tests: No results for input(s): TSH, T4TOTAL, FREET4, T3FREE, THYROIDAB in the last 72 hours. Anemia Panel: No results for input(s): VITAMINB12, FOLATE, FERRITIN, TIBC, IRON, RETICCTPCT in the last 72 hours. Sepsis Labs: No results for input(s): PROCALCITON, LATICACIDVEN in the last 168 hours.  No results found for this or any previous visit (from  the past 240 hour(s)).       Radiology Studies: No results found.      Scheduled Meds: . cholecalciferol  2,000 Units Oral Daily  . clonazePAM  0.5 mg Oral Daily  . divalproex  250 mg Oral Q12H  . enoxaparin (LOVENOX) injection  40 mg  Subcutaneous Daily  . escitalopram  10 mg Oral Daily  . feeding supplement (ENSURE ENLIVE)  237 mL Oral BID BM  . mouth rinse  15 mL Mouth Rinse BID  . OLANZapine  2.5 mg Oral Daily   And  . OLANZapine  5 mg Oral QHS   Continuous Infusions: . dextrose 5 % and 0.45 % NaCl with KCl 10 mEq/L 50 mL/hr at 05/29/18 1926     LOS: 12 days     Cordelia Poche, MD [facility] 05/30/2018, 10:40 AM  If 7PM-7AM, please contact night-coverage [email or web address]

## 2018-05-31 DIAGNOSIS — F419 Anxiety disorder, unspecified: Secondary | ICD-10-CM

## 2018-05-31 DIAGNOSIS — R451 Restlessness and agitation: Secondary | ICD-10-CM

## 2018-05-31 MED ORDER — TUBERCULIN PPD 5 UNIT/0.1ML ID SOLN
5.0000 [IU] | Freq: Once | INTRADERMAL | Status: AC
  Administered 2018-05-31: 5 [IU] via INTRADERMAL
  Filled 2018-05-31: qty 0.1

## 2018-05-31 NOTE — Progress Notes (Signed)
Pt re-evaluated by psychiatry today and recommended stable psychiatrically not requiring inpatient geripsych treatment any longer.  Spoke with pt's son- he would like to proceed with memory care ALF placement. Has looked into ALFs in area and CSW will send clinical information. Also needing therapy recommendations and TB skin test for ALF.  Spoke with admissions at [facility] to begin coordinating referral- pt's son also visiting another ALF tonight and will speak with CSW tomorrow if referral desired.   Sharren Bridge, MSW, LCSW Clinical Social Work 05/31/2018 [phone]

## 2018-05-31 NOTE — Progress Notes (Signed)
TB injection given in Right arm. It is circled. Please read results in 48 hours. Which is Wednesday Oct.23.

## 2018-05-31 NOTE — Consult Note (Signed)
[place] Psychiatry Consult   Reason for Consult:  Agitation Referring Physician: Dr Lonny Prude Patient Identification: Gina Edwards MRN:  [id number] Principal Diagnosis: Dementia with behavioral disturbance [facility]) Diagnosis:   Patient Active Problem List   Diagnosis Date Noted  . Dementia with behavioral disturbance ([place]) [F03.91] 12/02/2012    Priority: High  . Evaluation by psychiatric service required [Z00.8]   . Acute lower UTI [N39.0] 05/15/2018  . Sinusitis [J32.9] 05/15/2018  . UTI (urinary tract infection) [N39.0] 05/15/2018  . Pansinusitis [J32.4] 05/15/2018  . Aggressive behavior [R46.89]   . Depression [F32.9] 12/02/2012  . Anxiety [F41.9] 12/02/2012  . H/O meningioma of the brain [Z86.011] 12/02/2012    Total Time spent with patient: 45 minutes  Subjective:   Gina Edwards is a 77 y.o. female patient does not need geriatric psychiatry as she is not aggressive, medications controlling her impulsivity/aggression.  Dr Dwyane Dee has reviewed this patient and concurs with the treatment plan to discharge her to a nursing facility, not a geropsychiatric admission.  HPI:  77 yo female with dementia and aggression on admission.  Her medications were adjusted and she has stabilized.  Her sitter reports she just likes to throw her leg on the railing and if you move it, she moves it back.  Dementia symptoms dominate her presentation but no threats to self or others.  Psychiatrically stable.  Past Psychiatric History: dementia  Risk to Self: Suicidal Ideation: No Suicidal Intent: No Is patient at risk for suicide?: No Suicidal Plan?: No Access to Means: No What has been your use of drugs/alcohol within the last 12 months?: NA How many times?: 0 Other Self Harm Risks: NA Triggers for Past Attempts: None known Intentional Self Injurious Behavior: None Risk to Others: Homicidal Ideation: No Thoughts of Harm to Others: No Current Homicidal Intent: No Current Homicidal Plan:  No Access to Homicidal Means: No Identified Victim: NA History of harm to others?: Yes Assessment of Violence: On admission Violent Behavior Description: Gina Edwards Does patient have access to weapons?: No Criminal Charges Pending?: No Does patient have a court date: No Prior Inpatient Therapy: Prior Inpatient Therapy: No Prior Outpatient Therapy: Prior Outpatient Therapy: No Does patient have an ACCT team?: No Does patient have Intensive In-House Services?  : No Does patient have [facility] services? : No Does patient have P4CC services?: No  Past Medical History:  Past Medical History:  Diagnosis Date  . Anxiety 12/02/2012  . Anxiety and depression   . Dementia ([place]) 12/02/2012  . Depression 12/02/2012  . H/O meningioma of the brain 12/02/2012  . SUI (stress urinary incontinence, female)     Past Surgical History:  Procedure Laterality Date  . APPENDECTOMY  1957  . TUBAL LIGATION Bilateral 1975   Family History:  Family History  Problem Relation Age of Onset  . Heart disease Mother   . Colon cancer Mother 63  . Prostate cancer Father    Family Psychiatric  History: none Social History:  Social History   Substance and Sexual Activity  Alcohol Use No     Social History   Substance and Sexual Activity  Drug Use No    Social History   Socioeconomic History  . Marital status: Married    Spouse name: Not on file  . Number of children: Not on file  . Years of education: Not on file  . Highest education level: Not on file  Occupational History  . Not on file  Social Needs  .  Financial resource strain: Not on file  . Food insecurity:    Worry: Not on file    Inability: Not on file  . Transportation needs:    Medical: Not on file    Non-medical: Not on file  Tobacco Use  . Smoking status: Never Smoker  . Smokeless tobacco: Never Used  Substance and Sexual Activity  . Alcohol use: No  . Drug use: No  . Sexual activity: Yes    Partners: Male     Birth control/protection: Post-menopausal  Lifestyle  . Physical activity:    Days per week: Not on file    Minutes per session: Not on file  . Stress: Not on file  Relationships  . Social connections:    Talks on phone: Not on file    Gets together: Not on file    Attends religious service: Not on file    Active member of club or organization: Not on file    Attends meetings of clubs or organizations: Not on file    Relationship status: Not on file  Other Topics Concern  . Not on file  Social History Narrative   Gina live at home with spouse.    Caffeine Use- 24oz   Additional Social History:    Allergies:   Allergies  Allergen Reactions  . Ciprofloxacin   . Morphine And Related     Labs: No results found for this or any previous visit (from the past 48 hour(s)).  Current Facility-Administered Medications  Medication Dose Route Frequency Provider Last Rate Last Dose  . acetaminophen (TYLENOL) tablet 650 mg  650 mg Oral Q6H PRN Doutova, Anastassia, MD       Or  . acetaminophen (TYLENOL) suppository 650 mg  650 mg Rectal Q6H PRN Doutova, Anastassia, MD      . cholecalciferol (VITAMIN D) tablet 2,000 Units  2,000 Units Oral Edwards Toy Baker, MD   2,000 Units at 05/30/18 1446  . clonazePAM (KLONOPIN) tablet 0.5 mg  0.5 mg Oral Edwards Doutova, Anastassia, MD   0.5 mg at 05/30/18 1446  . dextrose 5 % and 0.45 % NaCl with KCl 10 mEq/L infusion   Intravenous Continuous Nita Sells, MD 50 mL/hr at 05/30/18 1445 1,000 mL at 05/30/18 1445  . divalproex (DEPAKOTE SPRINKLE) capsule 250 mg  250 mg Oral Q12H Patrecia Pour, MD   250 mg at 05/30/18 2130  . enoxaparin (LOVENOX) injection 40 mg  40 mg Subcutaneous Edwards Doutova, Anastassia, MD   40 mg at 05/30/18 1447  . escitalopram (LEXAPRO) tablet 10 mg  10 mg Oral Edwards Doutova, Anastassia, MD   10 mg at 05/30/18 1447  . feeding supplement (ENSURE ENLIVE) (ENSURE ENLIVE) liquid 237 mL  237 mL Oral BID BM Patrecia Pour, MD    237 mL at 05/30/18 1008  . HYDROcodone-acetaminophen (NORCO/VICODIN) 5-325 MG per tablet 1-2 tablet  1-2 tablet Oral Q4H PRN Toy Baker, MD   2 tablet at 05/27/18 1149  . LORazepam (ATIVAN) injection 1 mg  1 mg Intravenous Q6H PRN Patrecia Pour, MD   1 mg at 05/29/18 2202  . LORazepam (ATIVAN) tablet 1 mg  1 mg Oral Q6H PRN Patrecia Pour, MD   1 mg at 05/28/18 1058  . MEDLINE mouth rinse  15 mL Mouth Rinse BID Toy Baker, MD   15 mL at 05/30/18 2133  . OLANZapine (ZYPREXA) tablet 2.5 mg  2.5 mg Oral Edwards Nita Sells, MD   2.5 mg at 05/29/18 1138  And  . OLANZapine (ZYPREXA) tablet 5 mg  5 mg Oral QHS Nita Sells, MD   5 mg at 05/30/18 2130  . ondansetron (ZOFRAN) tablet 4 mg  4 mg Oral Q6H PRN Doutova, Anastassia, MD       Or  . ondansetron (ZOFRAN) injection 4 mg  4 mg Intravenous Q6H PRN Doutova, Anastassia, MD      . polyethylene glycol (MIRALAX / GLYCOLAX) packet 17 g  17 g Oral Edwards PRN Toy Baker, MD   17 g at 05/17/18 1050    Musculoskeletal: Strength & Muscle Tone: decreased Gait & Station: did not see her ambulate Patient leans: Right  Psychiatric Specialty Exam: Physical Exam  Nursing note and vitals reviewed. Constitutional: She appears well-developed and well-nourished.  HENT:  Head: Normocephalic.  Neck: Normal range of motion.  Respiratory: Effort normal.  Neurological: She is alert.  Psychiatric: Her speech is normal and behavior is normal. Thought content normal. Her affect is blunt. Cognition and memory are impaired. She expresses impulsivity.    Review of Systems  Psychiatric/Behavioral: Positive for memory loss.  All other systems reviewed and are negative.   Blood pressure 128/71, pulse 97, temperature 98.9 F (37.2 C), temperature source Axillary, resp. rate 20, weight 58 kg, SpO2 94 %.Body mass index is 20.64 kg/m.  General Appearance: Casual  Eye Contact:  Good  Speech:  Negative  Volume:  No speech   Mood:  Euthymic  Affect:  Blunt  Thought Process: Unable to assess, nonverbal  Orientation:  Other:  person  Thought Content:  unable to assess, nonverbal  Suicidal Thoughts:  no self harm behaviors  Homicidal Thoughts:  no threatening behaviors to others  Memory:  nonverbal  Judgement:  Impaired  Insight:  nonverbal, unable to assess  Psychomotor Activity:  Normal  Concentration:  Concentration: Fair and Attention Span: Fair  Recall:  unable to assess, nonverbal  Fund of Knowledge:  nonverbal, unable to assess  Language:  Negative  Akathisia:  No  Handed:  Right  AIMS (if indicated):     Assets:  Leisure Time Resilience Social Support  ADL's:  Impaired  Cognition:  Impaired,  Severe  Sleep:        Treatment Plan Summary: Dementia with behavioral disturbance: -Continue Klonopin 0.5 mg Edwards for anxiety -Continue Depakote 250 mg BID for mood stabilization -Continue Zyprexa 2.5 mg in the am and 5 mg in the pm for mood stabilization -Continue Ativan PRN agitation -Continue Lexapro 10 mg Edwards for depression  Disposition: No evidence of imminent risk to self or others at present.    Waylan Boga, NP 05/31/2018 11:37 AM

## 2018-05-31 NOTE — Care Management Important Message (Signed)
Important Message  Patient Details  Name: Gina Edwards MRN: [id number] Date of Birth: 02-Aug-1941   Medicare Important Message Given:  Yes    Kerin Salen 05/31/2018, 10:50 AMImportant Message  Patient Details  Name: Gina Edwards MRN: [id number] Date of Birth: 1940-09-27   Medicare Important Message Given:  Yes    Kerin Salen 05/31/2018, 10:49 AM

## 2018-05-31 NOTE — Progress Notes (Signed)
PROGRESS NOTE    Gina Edwards  FVC:[id number] DOB: Sep 16, 1940 DOA: 05/12/2018 PCP: Shon Baton, MD   Brief Narrative: Gina Edwards is a 77 y.o. female with a history of dementia, bipolar disorder that presented secondary to agitation and aggressive behavior. Plan for inpatient behavioral health admission.   Assessment & Plan:   Principal Problem:   Dementia with behavioral disturbance ([place]) Active Problems:   Aggressive behavior   Acute lower UTI   Sinusitis   UTI (urinary tract infection)   Pansinusitis   Evaluation by psychiatric service required   Dementia with behavioral disturbance Continues to be less combative. -Psychiatry recommendations: inpatient behavioral admission -Continue Zyprexa, depakote, lexapro, ativan prn -Will re-consult psychiatry today  Klebsiella UTI Treated with ceftriaxone/Keflex. Completed course.  Poor oral intake In setting of acute psychosis. Improved slightly. -Encourage oral intake -IV fluids -Daily weight  Decreased urine output Resolved.  Pansinusitis Concern for chronic sinusitis vs fungal ball. Recommendation for ENT follow-up   DVT prophylaxis: Lovenox Code Status:   Code Status: DNR Family Communication: Son at bedside Disposition Plan: Discharge to inpatient [facility] when bed available. Medical stable.   Consultants:   Psychiatry  Procedures:   None  Antimicrobials:  Keflex (10/2>>10/4; 10/7>>10/11)  Ceftriaxone (10/5>>10/7)    Subjective: No issues overnight per discussion with nursing.  Objective: Vitals:   05/30/18 1448 05/30/18 1854 05/30/18 2043 05/31/18 0559  BP: (!) 124/99  (!) 153/87 128/71  Pulse: 97  (!) 104 97  Resp: 20  20 20   Temp: 98.1 F (36.7 C)  98.9 F (37.2 C) 98.9 F (37.2 C)  TempSrc: Oral  Axillary Axillary  SpO2: 94%  95% 94%  Weight:  58.3 kg  58 kg    Intake/Output Summary (Last 24 hours) at 05/31/2018 1043 Last data filed at 05/31/2018 0551 Gross per 24 hour  Intake  1329.9 ml  Output 1050 ml  Net 279.9 ml   Filed Weights   05/29/18 1136 05/30/18 1854 05/31/18 0559  Weight: 54.6 kg 58.3 kg 58 kg    Examination:  General exam: Appears calm and comfortable Respiratory system: Clear to auscultation. Respiratory effort normal. Cardiovascular system: S1 & S2 heard, RRR. No murmurs, rubs, gallops or clicks. Gastrointestinal system: Abdomen is nondistended, soft and nontender. No organomegaly or masses felt. Normal bowel sounds heard. Central nervous system: Alert. No focal neurological deficits. Extremities: No edema. No calf tenderness Skin: No cyanosis. No rashes Psychiatry: Flat affect   Data Reviewed: I have personally reviewed following labs and imaging studies  CBC: No results for input(s): WBC, NEUTROABS, HGB, HCT, MCV, PLT in the last 168 hours. Basic Metabolic Panel: No results for input(s): NA, K, CL, CO2, GLUCOSE, BUN, CREATININE, CALCIUM, MG, PHOS in the last 168 hours. GFR: Estimated Creatinine Clearance: 53.1 mL/min (by C-G formula based on SCr of 0.55 mg/dL). Liver Function Tests: No results for input(s): AST, ALT, ALKPHOS, BILITOT, PROT, ALBUMIN in the last 168 hours. No results for input(s): LIPASE, AMYLASE in the last 168 hours. No results for input(s): AMMONIA in the last 168 hours. Coagulation Profile: No results for input(s): INR, PROTIME in the last 168 hours. Cardiac Enzymes: No results for input(s): CKTOTAL, CKMB, CKMBINDEX, TROPONINI in the last 168 hours. BNP (last 3 results) No results for input(s): PROBNP in the last 8760 hours. HbA1C: No results for input(s): HGBA1C in the last 72 hours. CBG: No results for input(s): GLUCAP in the last 168 hours. Lipid Profile: No results for input(s): CHOL, HDL, LDLCALC, TRIG,  CHOLHDL, LDLDIRECT in the last 72 hours. Thyroid Function Tests: No results for input(s): TSH, T4TOTAL, FREET4, T3FREE, THYROIDAB in the last 72 hours. Anemia Panel: No results for input(s):  VITAMINB12, FOLATE, FERRITIN, TIBC, IRON, RETICCTPCT in the last 72 hours. Sepsis Labs: No results for input(s): PROCALCITON, LATICACIDVEN in the last 168 hours.  No results found for this or any previous visit (from the past 240 hour(s)).       Radiology Studies: No results found.      Scheduled Meds: . cholecalciferol  2,000 Units Oral Daily  . clonazePAM  0.5 mg Oral Daily  . divalproex  250 mg Oral Q12H  . enoxaparin (LOVENOX) injection  40 mg Subcutaneous Daily  . escitalopram  10 mg Oral Daily  . feeding supplement (ENSURE ENLIVE)  237 mL Oral BID BM  . mouth rinse  15 mL Mouth Rinse BID  . OLANZapine  2.5 mg Oral Daily   And  . OLANZapine  5 mg Oral QHS   Continuous Infusions: . dextrose 5 % and 0.45 % NaCl with KCl 10 mEq/L 1,000 mL (05/30/18 1445)     LOS: 13 days     Cordelia Poche, MD [facility] 05/31/2018, 10:43 AM  If 7PM-7AM, please contact night-coverage [email or web address]

## 2018-06-01 NOTE — Progress Notes (Signed)
Nutrition Follow-up  INTERVENTION:   Continue Ensure Enlive po BID, each supplement provides 350 kcal and 20 grams of protein  NUTRITION DIAGNOSIS:   Inadequate oral intake related to (AMS, dementia) as evidenced by meal completion < 50%.  Ongoing.  GOAL:   Patient will meet greater than or equal to 90% of their needs  Progressing.  MONITOR:   PO intake, Supplement acceptance, Labs, Weight trends, I & O's  ASSESSMENT:   77 y.o. female with a history of dementia, anxiety, and depression who presented from home with agitation and progressively worsening aggressive behavior  Per chart review, pt is now stable, awaiting discharge to ALF. Pt consumed 10-75% of meals on 10/21. Pt is drinking Ensure supplements. Patient was weighed 10/19 and every day since, now weight records show a 14 lb weight loss since 6/29 (10% wt loss x 4 months, significant for time frame).  Labs reviewed. Medications: Vitamin D tablet daily, D5 and .45% NaCl w/ KCl infusion at 50 ml/hr  Diet Order:   Diet Order            Diet Heart Room service appropriate? Yes; Fluid consistency: Thin  Diet effective now              EDUCATION NEEDS:   Not appropriate for education at this time  Skin:  Skin Assessment: Reviewed RN Assessment  Last BM:  10/18  Height:   Ht Readings from Last 1 Encounters:  02/06/18 5\' 6"  (1.676 m)    Weight:   Wt Readings from Last 1 Encounters:  06/01/18 58 kg    Ideal Body Weight:  (No height measurement in chart)  BMI:  Body mass index is 20.64 kg/m.  Estimated Nutritional Needs:   Kcal:  6578-4696  Protein:  70-80g  Fluid:  1.6L/day  Clayton Bibles, MS, RD, LDN [facility] Pager: [phone] After Hours Pager: [phone]

## 2018-06-01 NOTE — Progress Notes (Addendum)
PROGRESS NOTE    Gina Edwards  SAY:[id number] DOB: [birth year] DOA: 05/12/2018 PCP: Shon Baton, MD   Brief Narrative: Gina Edwards is a 77 y.o. female with a history of dementia, bipolar disorder that presented secondary to agitation and aggressive behavior. Initial plan for inpatient behavioral health admission, however, patient now stable for discharge to ALF.   Assessment & Plan:   Principal Problem:   Dementia with behavioral disturbance ([place]) Active Problems:   Aggressive behavior   Acute lower UTI   Sinusitis   UTI (urinary tract infection)   Pansinusitis   Evaluation by psychiatric service required   Dementia with behavioral disturbance Continues to be less combative. -Psychiatry recommendations: no longer requiring inpatient behavioral health admission -Continue Zyprexa, depakote, lexapro, ativan prn -Plan for ALF discharge  Klebsiella UTI Treated with ceftriaxone/Keflex. Completed course.  Poor oral intake In setting of acute psychosis. Improved slightly. -Encourage oral intake -IV fluids -Daily weight  Decreased urine output Resolved.  Pansinusitis Concern for chronic sinusitis vs fungal ball. Recommendation for ENT follow-up  PPD placement Placed on 06/02/18   DVT prophylaxis: Lovenox Code Status:   Code Status: DNR Family Communication: Son at bedside Disposition Plan: Discharge to ALF when bed availabl and pending PT eval. Medical stable.   Consultants:   Psychiatry  Procedures:   None  Antimicrobials:  Keflex (10/2>>10/4; 10/7>>10/11)  Ceftriaxone (10/5>>10/7)    Subjective: No concerns. States that I talk a lot.  Objective: Vitals:   05/31/18 0559 05/31/18 2302 06/01/18 0441 06/01/18 0700  BP: 128/71 125/70 135/66   Pulse: 97 76 71   Resp: 20 16 17    Temp: 98.9 F (37.2 C) 99.1 F (37.3 C) 97.9 F (36.6 C)   TempSrc: Axillary Axillary Oral   SpO2: 94% 98% 97%   Weight: 58 kg   58 kg    Intake/Output Summary (Last  24 hours) at 06/01/2018 1021 Last data filed at 06/01/2018 0309 Gross per 24 hour  Intake 1479.45 ml  Output 700 ml  Net 779.45 ml   Filed Weights   05/30/18 1854 05/31/18 0559 06/01/18 0700  Weight: 58.3 kg 58 kg 58 kg    Examination:  General: Well appearing, no distress Psych: Normal mood, blunt affect   Data Reviewed: I have personally reviewed following labs and imaging studies  CBC: No results for input(s): WBC, NEUTROABS, HGB, HCT, MCV, PLT in the last 168 hours. Basic Metabolic Panel: No results for input(s): NA, K, CL, CO2, GLUCOSE, BUN, CREATININE, CALCIUM, MG, PHOS in the last 168 hours. GFR: Estimated Creatinine Clearance: 53.1 mL/min (by C-G formula based on SCr of 0.55 mg/dL). Liver Function Tests: No results for input(s): AST, ALT, ALKPHOS, BILITOT, PROT, ALBUMIN in the last 168 hours. No results for input(s): LIPASE, AMYLASE in the last 168 hours. No results for input(s): AMMONIA in the last 168 hours. Coagulation Profile: No results for input(s): INR, PROTIME in the last 168 hours. Cardiac Enzymes: No results for input(s): CKTOTAL, CKMB, CKMBINDEX, TROPONINI in the last 168 hours. BNP (last 3 results) No results for input(s): PROBNP in the last 8760 hours. HbA1C: No results for input(s): HGBA1C in the last 72 hours. CBG: No results for input(s): GLUCAP in the last 168 hours. Lipid Profile: No results for input(s): CHOL, HDL, LDLCALC, TRIG, CHOLHDL, LDLDIRECT in the last 72 hours. Thyroid Function Tests: No results for input(s): TSH, T4TOTAL, FREET4, T3FREE, THYROIDAB in the last 72 hours. Anemia Panel: No results for input(s): VITAMINB12, FOLATE, FERRITIN, TIBC, IRON, RETICCTPCT  in the last 72 hours. Sepsis Labs: No results for input(s): PROCALCITON, LATICACIDVEN in the last 168 hours.  No results found for this or any previous visit (from the past 240 hour(s)).       Radiology Studies: No results found.      Scheduled Meds: .  cholecalciferol  2,000 Units Oral Daily  . clonazePAM  0.5 mg Oral Daily  . divalproex  250 mg Oral Q12H  . enoxaparin (LOVENOX) injection  40 mg Subcutaneous Daily  . escitalopram  10 mg Oral Daily  . feeding supplement (ENSURE ENLIVE)  237 mL Oral BID BM  . mouth rinse  15 mL Mouth Rinse BID  . OLANZapine  2.5 mg Oral Daily   And  . OLANZapine  5 mg Oral QHS  . tuberculin  5 Units Intradermal Once   Continuous Infusions: . dextrose 5 % and 0.45 % NaCl with KCl 10 mEq/L 50 mL/hr at 06/01/18 0855     LOS: 14 days     Cordelia Poche, MD [facility] 06/01/2018, 10:21 AM  If 7PM-7AM, please contact night-coverage [email or web address]

## 2018-06-01 NOTE — Progress Notes (Signed)
CSW working with pt's son/family on DC plan.  Pt working with PT today demonstrated potential for SNF level rehab per session note. Discussed this with son and with ALF of interest [facility]. ALF planning to come to in-person assessment with pt tomorrow 06/02/18. Will assess whether in-house therapist at ALF can manage pt's therapy needs. Son open to short term rehab at [facility] if needed. CSW made referrals in order to gain potential options.  Plan: remains to admit to ALF, [facility] coming to assess pt 06/02/18. If ALF unable to manage therapy needs, may pursue SNF.  Sharren Bridge, MSW, LCSW Clinical Social Work 06/01/2018 [phone]

## 2018-06-01 NOTE — Evaluation (Signed)
Physical Therapy Evaluation Patient Details Name: Gina Edwards MRN: [id number] DOB: 09-May-1941 Today's Date: 06/01/2018   History of Present Illness  Pt was admitted for dementia with behavioral disturbance, Dx of UTI. PMH: anxiety and depression  Clinical Impression  Pt admitted with above diagnosis. Pt currently with functional limitations due to the deficits listed below (see PT Problem List). Mod assist for supine to sit, +2 max assist for stand pivot transfer from bed to recliner. Pt has poor balance in sitting and standing and is a high fall risk. 24* supervision recommended.  Pt will benefit from skilled PT to increase their independence and safety with mobility to allow discharge to the venue listed below.       Follow Up Recommendations SNF;Supervision/Assistance - 24 hour;Supervision for mobility/OOB(plans for geri psyche unit)    Equipment Recommendations  Wheelchair (measurements PT);Wheelchair cushion (measurements PT)(may need WC if mobility remains limited)    Recommendations for Other Services       Precautions / Restrictions Precautions Precaution Comments: aggressive behavior at times including hitting, biting, spitting Restrictions Weight Bearing Restrictions: No      Mobility  Bed Mobility   Bed Mobility: Rolling;Sidelying to Sit     Supine to sit: Mod assist Sit to supine: Mod assist   General bed mobility comments: mod A to initiate roll and then to raise trunk  Transfers Overall transfer level: Needs assistance Equipment used: 2 person hand held assist Transfers: Sit to/from Stand;Stand Pivot Transfers   Stand pivot transfers: Max assist;+2 physical assistance;+2 safety/equipment       General transfer comment: modified SPT; pt did not stand completely.  Took small steps from bed to recliner, requires 2 assist for support and balance. Pt with posterior lean and flexed knees in standing.   Ambulation/Gait             General Gait  Details: unable 2* poor standing balance  Stairs            Wheelchair Mobility    Modified Rankin (Stroke Patients Only)       Balance Overall balance assessment: Needs assistance Sitting-balance support: Feet supported;Bilateral upper extremity supported Sitting balance-Leahy Scale: Poor Sitting balance - Comments: min A for R lateral lean Postural control: Right lateral lean   Standing balance-Leahy Scale: Zero Standing balance comment: posterior lean                             Pertinent Vitals/Pain Pain Assessment: Faces Faces Pain Scale: No hurt    Home Living Family/patient expects to be discharged to:: (geri Psych unit)                 Additional Comments: Per chart, patient's spouse has passed away.     Prior Function           Comments: Per sitter and chart, patient ambulatory in ED.  Has been aggressive and medicated for behavioral issues.     Hand Dominance        Extremity/Trunk Assessment   Upper Extremity Assessment Upper Extremity Assessment: Defer to OT evaluation    Lower Extremity Assessment Lower Extremity Assessment: Generalized weakness RLE Deficits / Details: bears weight with flexed knees but minimally able to move the feet    Cervical / Trunk Assessment Cervical / Trunk Assessment: Normal  Communication   Communication: No difficulties  Cognition Arousal/Alertness: Awake/alert Behavior During Therapy: WFL for tasks assessed/performed Overall Cognitive Status: No family/caregiver  present to determine baseline cognitive functioning                                 General Comments: h/o dementia.  Pt followed many simple one step commands in context.  Not oriented to location/situation. Could only state month of her birthdate. Frequently her verbal responses to questions were not at all related to the question.       General Comments      Exercises     Assessment/Plan    PT Assessment  Patient needs continued PT services  PT Problem List Decreased activity tolerance;Decreased balance;Decreased mobility;Decreased cognition       PT Treatment Interventions Gait training;Functional mobility training;Therapeutic activities    PT Goals (Current goals can be found in the Care Plan section)  Acute Rehab PT Goals Patient Stated Goal: none stated PT Goal Formulation: Patient unable to participate in goal setting Time For Goal Achievement: 06/15/18 Potential to Achieve Goals: Fair    Frequency Min 2X/week   Barriers to discharge        Co-evaluation               AM-PAC PT "6 Clicks" Daily Activity  Outcome Measure Difficulty turning over in bed (including adjusting bedclothes, sheets and blankets)?: Unable Difficulty moving from lying on back to sitting on the side of the bed? : Unable Difficulty sitting down on and standing up from a chair with arms (e.g., wheelchair, bedside commode, etc,.)?: Unable Help needed moving to and from a bed to chair (including a wheelchair)?: A Lot Help needed walking in hospital room?: Total Help needed climbing 3-5 steps with a railing? : Total 6 Click Score: 7    End of Session Equipment Utilized During Treatment: Gait belt Activity Tolerance: Patient tolerated treatment well Patient left: with call bell/phone within reach;with nursing/sitter in room;in chair(pt has sitter in room) Nurse Communication: Mobility status PT Visit Diagnosis: Unsteadiness on feet (R26.81)    Time: 3291-9166 PT Time Calculation (min) (ACUTE ONLY): 14 min   Charges:   PT Evaluation $PT Re-evaluation: 1 Re-eval          Philomena Doheny PT 06/01/2018  Acute Rehabilitation Services Pager [phone] Office [phone]

## 2018-06-02 LAB — GLUCOSE, CAPILLARY: Glucose-Capillary: 103 mg/dL — ABNORMAL HIGH (ref 70–99)

## 2018-06-02 NOTE — Progress Notes (Signed)
TB skin test administered on 05/31/18 @ 1700 to right forearm is negative.
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
## 2018-06-02 NOTE — Progress Notes (Signed)
PROGRESS NOTE    SHERRIANN SZUCH  GLO:[id number] DOB: 1940-11-22 DOA: 05/12/2018 PCP: Shon Baton, MD   Brief Narrative: Gina Edwards is a 77 y.o. female with a history of dementia, bipolar disorder that presented secondary to agitation and aggressive behavior. Initial plan for inpatient behavioral health admission, however, patient now stable for discharge to ALF per psych re-assessment 10/21   Assessment & Plan:   Principal Problem:   Dementia with behavioral disturbance ([place]) Active Problems:   Aggressive behavior   Acute lower UTI   Sinusitis   UTI (urinary tract infection)   Pansinusitis   Evaluation by psychiatric service required   Dementia with behavioral disturbance Continues to be less combative--sitter to d/c at 16:00 -Psychiatry recommendations: no longer requiring inpatient behavioral health admission -Continue Zyprexa, depakote, lexapro, ativan prn -Plan for ALF discharge  Klebsiella UTI Treated with ceftriaxone/Keflex. Completed course.  Poor oral intake In setting of acute psychosis. Improved slightly. -Encourage oral intake -IV fluids -Daily weight  Decreased urine output Resolved.  Pansinusitis Concern for chronic sinusitis vs fungal ball. Recommendation for ENT follow-up  PPD placement Placed on 06/02/18   DVT prophylaxis: Lovenox Code Status:   Code Status: DNR Family Communication: Son at bedside Disposition Plan: Discharge to ALF when bed availabl and pending PT eval. Medical stable.   Consultants:   Psychiatry  Procedures:   None  Antimicrobials:  Keflex (10/2>>10/4; 10/7>>10/11)  Ceftriaxone (10/5>>10/7)    Subjective:  No new issue eatign comfortably Not violent  Objective: Vitals:   06/01/18 1643 06/01/18 2219 06/02/18 0500 06/02/18 0644  BP: 124/62 140/70  (!) 150/72  Pulse: 62 64  70  Resp:  18  15  Temp: 97.9 F (36.6 C) 97.8 F (36.6 C)  (!) 97.5 F (36.4 C)  TempSrc: Oral Axillary  Axillary  SpO2: 96%  98%  99%  Weight:   58.1 kg     Intake/Output Summary (Last 24 hours) at 06/02/2018 1400 Last data filed at 06/02/2018 1117 Gross per 24 hour  Intake 360 ml  Output 500 ml  Net -140 ml   Filed Weights   05/31/18 0559 06/01/18 0700 06/02/18 0500  Weight: 58 kg 58 kg 58.1 kg    Examination:  General: Well appearing, no distress Psych: Normal mood, blunt affect s1 s2 no m    Data Reviewed: I have personally reviewed following labs and imaging studies  CBC: No results for input(s): WBC, NEUTROABS, HGB, HCT, MCV, PLT in the last 168 hours. Basic Metabolic Panel: No results for input(s): NA, K, CL, CO2, GLUCOSE, BUN, CREATININE, CALCIUM, MG, PHOS in the last 168 hours. GFR: Estimated Creatinine Clearance: 53.2 mL/min (by C-G formula based on SCr of 0.55 mg/dL). Liver Function Tests: No results for input(s): AST, ALT, ALKPHOS, BILITOT, PROT, ALBUMIN in the last 168 hours. No results for input(s): LIPASE, AMYLASE in the last 168 hours. No results for input(s): AMMONIA in the last 168 hours. Coagulation Profile: No results for input(s): INR, PROTIME in the last 168 hours. Cardiac Enzymes: No results for input(s): CKTOTAL, CKMB, CKMBINDEX, TROPONINI in the last 168 hours. BNP (last 3 results) No results for input(s): PROBNP in the last 8760 hours. HbA1C: No results for input(s): HGBA1C in the last 72 hours. CBG: Recent Labs  Lab 05/31/18 0014  GLUCAP 103*   Lipid Profile: No results for input(s): CHOL, HDL, LDLCALC, TRIG, CHOLHDL, LDLDIRECT in the last 72 hours. Thyroid Function Tests: No results for input(s): TSH, T4TOTAL, FREET4, T3FREE, THYROIDAB in the  last 72 hours. Anemia Panel: No results for input(s): VITAMINB12, FOLATE, FERRITIN, TIBC, IRON, RETICCTPCT in the last 72 hours. Sepsis Labs: No results for input(s): PROCALCITON, LATICACIDVEN in the last 168 hours.  No results found for this or any previous visit (from the past 240 hour(s)).       Radiology  Studies: No results found.      Scheduled Meds: . cholecalciferol  2,000 Units Oral Daily  . clonazePAM  0.5 mg Oral Daily  . divalproex  250 mg Oral Q12H  . enoxaparin (LOVENOX) injection  40 mg Subcutaneous Daily  . escitalopram  10 mg Oral Daily  . feeding supplement (ENSURE ENLIVE)  237 mL Oral BID BM  . mouth rinse  15 mL Mouth Rinse BID  . OLANZapine  2.5 mg Oral Daily   And  . OLANZapine  5 mg Oral QHS  . tuberculin  5 Units Intradermal Once   Continuous Infusions: . dextrose 5 % and 0.45 % NaCl with KCl 10 mEq/L 50 mL/hr at 06/01/18 0855     LOS: 15 days     Verneita Griffes, MD [facility] 2:00 PM  If 7PM-7AM, please contact night-coverage [email or web address]

## 2018-06-03 NOTE — Progress Notes (Signed)
Occupational Therapy Treatment Patient Details Name: Gina Edwards MRN: [id number] DOB: 05-19-1941 Today's Date: 06/03/2018    History of present illness Pt was admitted for dementia with behavioral disturbance, Dx of UTI. PMH: anxiety and depression   OT comments  Pt seen as co-treat with PT today. She continues to require +2 assist during functional mobility for pt/therapist safety. Level of assistance fluctuates from +2 Min-+2 Mod A during session today. Will cont to follow acutely.   Follow Up Recommendations  Other (comment)(Geri psych)    Equipment Recommendations  3 in 1 bedside commode    Recommendations for Other Services      Precautions / Restrictions Precautions Precautions: Fall Precaution Comments: aggressive behavior at times including hitting, biting, spitting Restrictions Weight Bearing Restrictions: No       Mobility Bed Mobility Overal bed mobility: Needs Assistance Bed Mobility: Supine to Sit;Sit to Supine     Supine to sit: Min assist Sit to supine: Min assist   General bed mobility comments: Min A with increased time to raise truck. Min A w/ LE's to get in/out of bed. +2 Min A to scoot up/reposition in bed using pad  Transfers Overall transfer level: Needs assistance Equipment used: 2 person hand held assist Transfers: Sit to/from [facility] Sit to Stand: Mod assist;+2 physical assistance;+2 safety/equipment Stand pivot transfers: Min assist;Mod assist;+2 physical assistance;+2 safety/equipment       General transfer comment: Pt takes small steps from bed to recliner & from recliner around bed in room and back, requires 2 assist for support and balance & safety. Pt with posterior lean and flexed knees in standing.     Balance Overall balance assessment: Needs assistance Sitting-balance support: Feet supported;Bilateral upper extremity supported Sitting balance-Leahy Scale: Fair   Postural control: Posterior lean    Standing balance-Leahy Scale: Poor Standing balance comment: posterior lean with knees flexed in standing very narrow base of stance                           ADL either performed or assessed with clinical judgement   ADL Overall ADL's : Needs assistance/impaired     Grooming: Moderate assistance;Sitting                   Toilet Transfer: +2 for physical assistance;Moderate assistance;Stand-pivot;BSC(Simulated transfer from EOB to chair x2)   Toileting- Clothing Manipulation and Hygiene: Maximal assistance;+2 for physical assistance;Sit to/from stand       Functional mobility during ADLs: Moderate assistance;+2 for physical assistance;Cueing for safety;Cueing for sequencing General ADL Comments: Pt required fluctuating assist during this therapy session which varied from +2 Mod A to +1 Mod A noted. Overall Max A +2 for self care. Pt ambulated some with PT/OT in room w/ noted narrow base of stance and small steps.     Vision       Perception     Praxis      Cognition Arousal/Alertness: Awake/alert Behavior During Therapy: WFL for tasks assessed/performed(Pt with sitter in room and restraints/mits on hands) Overall Cognitive Status: No family/caregiver present to determine baseline cognitive functioning                                 General Comments: h/o dementia.  Pt followed many simple one step commands in context.  Not oriented to location/situation. Could only state month of her birthdate.  Exercises     Shoulder Instructions       General Comments      Pertinent Vitals/ Pain       Pain Assessment: Faces Faces Pain Scale: No hurt  Home Living                                          Prior Functioning/Environment              Frequency  Min 2X/week        Progress Toward Goals  OT Goals(current goals can now be found in the care plan section)  Progress towards OT goals: Progressing  toward goals  Acute Rehab OT Goals Patient Stated Goal: none stated  Plan Discharge plan remains appropriate    Co-evaluation    PT/OT/SLP Co-Evaluation/Treatment: Yes Reason for Co-Treatment: For patient/therapist safety PT goals addressed during session: Mobility/safety with mobility OT goals addressed during session: ADL's and self-care      AM-PAC PT "6 Clicks" Daily Activity     Outcome Measure   Help from another person eating meals?: Total Help from another person taking care of personal grooming?: A Lot Help from another person toileting, which includes using toliet, bedpan, or urinal?: Total Help from another person bathing (including washing, rinsing, drying)?: Total Help from another person to put on and taking off regular upper body clothing?: A Lot Help from another person to put on and taking off regular lower body clothing?: Total 6 Click Score: 8    End of Session Equipment Utilized During Treatment: Gait belt  OT Visit Diagnosis: Unsteadiness on feet (R26.81)   Activity Tolerance Patient tolerated treatment well   Patient Left in bed;with call bell/phone within reach;with bed alarm set;with nursing/sitter in room;with restraints reapplied(Mits on hands and bed rails up)   Nurse Communication Other (comment)(Pt need to be cleaned up as pure wick appears to have fallen out prior to therapy arrival/bed soiled)        Time: 9458-5929 OT Time Calculation (min): 15 min  Charges: OT General Charges $OT Visit: 1 Visit OT Treatments $Self Care/Home Management : 8-22 mins    Ashley Bultema Beth Dixon, OTR/L 06/03/2018, 11:25 AM

## 2018-06-03 NOTE — Progress Notes (Signed)
Patient sleepy--received oxy last pm Events noted overnight Patient was confused and needed supervision intermittently--safety sitter requested again last pm  Await dispo per CSW when available   Verneita Griffes, MD [facility] 8:43 AM

## 2018-06-03 NOTE — Progress Notes (Signed)
Discussed disposition plans with pt's son- yesterday [facility] assessed pt and advised that her level of rehab need cannot be met at AL.  Son in agreement to pursue SNF for rehab prior to moving into ALF. CSW provided him with SNF bed offers thus far to research/visit. Pt requiring 1 to 1 supervision/sitter, will need to be able to discontinue safely prior to being able to admit to SNF/ALF.  Sharren Bridge, MSW, LCSW Clinical Social Work 06/04/2018 [phone]

## 2018-06-03 NOTE — Progress Notes (Signed)
Physical Therapy Treatment Patient Details Name: Gina Edwards MRN: [id number] DOB: Aug 20, 1940 Today's Date: 06/03/2018    History of Present Illness Pt was admitted for dementia with behavioral disturbance, Dx of UTI. PMH: anxiety and depression    PT Comments     The patient ambulated with 2 assist hand hold today in room. Gait is shuffling and leaning to the right. Patient was not gitated during treatment but was lethargic/sluggish. Continue PT efforts.   Follow Up Recommendations  SNF;Supervision/Assistance - 24 hour;Supervision for mobility/OOB     Equipment Recommendations    none   Recommendations for Other Services       Precautions / Restrictions Precautions Precautions: Fall Precaution Comments: aggressive behavior at times including hitting, biting, spitting Restrictions Weight Bearing Restrictions: No    Mobility  Bed Mobility Overal bed mobility: Needs Assistance Bed Mobility: Supine to Sit;Sit to Supine Rolling: Min assist Sidelying to sit: Min assist Supine to sit: Min assist Sit to supine: Min assist   General bed mobility comments: extra time to mobilize with cues to raise trunk and assist with legs to get OOB. Tactile cues to lie down assist with legs at end.  Transfers Overall transfer level: Needs assistance Equipment used: 2 person hand held assist Transfers: Sit to/from [facility] Sit to Stand: Mod assist;+2 physical assistance;+2 safety/equipment Stand pivot transfers: Min assist;Mod assist;+2 physical assistance;+2 safety/equipment       General transfer comment: Pt takes small steps from bed to recliner & from recliner   Ambulation/Gait Ambulation/Gait assistance: Mod assist;+2 physical assistance;+2 safety/equipment Gait Distance (Feet): 30 Feet   Gait Pattern/deviations: Step-to pattern;Staggering right;Drifts right/left;Decreased stride length;Trunk flexed Gait velocity: decr   General Gait Details: initially  required 2 assist handhold to ambulate. Gradually able to decrease to 1 HHA and safety belt with +1 for safety and equipment. Patient stumbling at times.   Stairs             Wheelchair Mobility    Modified Rankin (Stroke Patients Only)       Balance Overall balance assessment: Needs assistance Sitting-balance support: Feet supported;Bilateral upper extremity supported Sitting balance-Leahy Scale: Fair   Postural control: Posterior lean   Standing balance-Leahy Scale: Poor Standing balance comment: posterior lean with knees flexed in standing very narrow base of stance                            Cognition Arousal/Alertness: Lethargic;Suspect due to medications(had ativan over night) Behavior During Therapy: Flat affect Overall Cognitive Status: No family/caregiver present to determine baseline cognitive functioning                                 General Comments: h/o dementia.  Pt followed many simple one step commands in context.  Not oriented to location/situation. stated her name as Gina Edwards      Exercises      General Comments        Pertinent Vitals/Pain Pain Assessment: Faces Faces Pain Scale: No hurt    Home Living                      Prior Function            PT Goals (current goals can now be found in the care plan section) Acute Rehab PT Goals Patient Stated Goal: none stated Progress towards PT goals:  Progressing toward goals    Frequency    Min 2X/week      PT Plan Current plan remains appropriate    Co-evaluation PT/OT/SLP Co-Evaluation/Treatment: Yes Reason for Co-Treatment: Complexity of the patient's impairments (multi-system involvement);For patient/therapist safety;To address functional/ADL transfers PT goals addressed during session: Mobility/safety with mobility OT goals addressed during session: ADL's and self-care      AM-PAC PT "6 Clicks" Daily Activity  Outcome Measure   Difficulty turning over in bed (including adjusting bedclothes, sheets and blankets)?: Unable Difficulty moving from lying on back to sitting on the side of the bed? : Unable Difficulty sitting down on and standing up from a chair with arms (e.g., wheelchair, bedside commode, etc,.)?: Unable Help needed moving to and from a bed to chair (including a wheelchair)?: Total Help needed walking in hospital room?: Total Help needed climbing 3-5 steps with a railing? : Total 6 Click Score: 6    End of Session Equipment Utilized During Treatment: Gait belt Activity Tolerance: Patient tolerated treatment well Patient left: in bed;with call bell/phone within reach;with nursing/sitter in room Nurse Communication: Mobility status       Time: 7124-5809 PT Time Calculation (min) (ACUTE ONLY): 15 min  Charges:    none                    Tresa Endo PT Acute Rehabilitation Services Pager [phone] Office [phone]    Claretha Cooper 06/03/2018, 12:02 PM

## 2018-06-04 NOTE — Progress Notes (Signed)
Awake but incoherent and not making much sense no new issues did not you much this morning No safety sitter so patient being moved closer to nursing station  Verneita Griffes, MD [facility] 1:28 PM

## 2018-06-05 NOTE — Progress Notes (Signed)
CSW following to assist with discharge coordination. Per RN, patient's son, Gerald Stabs, has selected [facility]. Patient currently has pending PASRR and cannot discharge to SNF until it is approved. This likely will not occur over the weekend.  [facility] is aware of plan to discharge there as soon as Rosalie Gums is approved. CSW will continue to follow for coordination.   Pricilla Holm, MSW, [place] Social Work [phone]

## 2018-06-05 NOTE — Progress Notes (Signed)
No acute changes-still confused and await placement

## 2018-06-06 NOTE — Progress Notes (Signed)
PROGRESS NOTE    Gina Edwards  XIP:[id number] DOB: [birth year] DOA: 05/12/2018 PCP: Shon Baton, MD   Brief Narrative: [name] is a 77 y.o. female with a history of dementia, bipolar disorder that presented secondary to agitation and aggressive behavior. Initial plan for inpatient behavioral health admission, however, patient now stable for discharge to ALF per psych re-assessment 10/21   Assessment & Plan:   Principal Problem:   Dementia with behavioral disturbance ([place]) Active Problems:   Aggressive behavior   Acute lower UTI   Sinusitis   UTI (urinary tract infection)   Pansinusitis   Evaluation by psychiatric service required   Dementia with behavioral disturbance Continues to be less combative--sitter to d/c at 16:00 -Psychiatry recommendations: no longer requiring inpatient behavioral health admission -Continue Zyprexa, depakote, lexapro, ativan prn -Plan for ALF discharge when bed found--SW getting PASSR -30 day note signed  Klebsiella UTI Treated with ceftriaxone/Keflex. Completed course.  Poor oral intake In setting of acute psychosis. Improved slightly. -Encourage oral intake -IV fluids -Daily weight  Decreased urine output Resolved.  Pansinusitis Concern for chronic sinusitis vs fungal ball. Recommendation for ENT follow-up  PPD placement Placed on 06/02/18   DVT prophylaxis: Lovenox Code Status:   Code Status: DNR Family Communication: Son at bedside Disposition Plan: Discharge to ALF when bed available and pending PT eval. Medical stable.   Consultants:   Psychiatry  Procedures:   None  Antimicrobials:  Keflex (10/2>>10/4; 10/7>>10/11)  Ceftriaxone (10/5>>10/7)    Subjective:  Sleeping today  Didn't need medicating Overall calmer per Tech in room  Objective: Vitals:   06/05/18 1130 06/05/18 1416 06/05/18 2142 06/06/18 1602  BP:  116/70 (!) 147/82 120/72  Pulse:  63 86 85  Resp:  16 18 16   Temp:    97.9 F (36.6 C)    TempSrc:    Oral  SpO2:  96% 97% 99%  Weight: 55.4 kg       Intake/Output Summary (Last 24 hours) at 06/06/2018 1639 Last data filed at 06/06/2018 1317 Gross per 24 hour  Intake 1952.8 ml  Output 950 ml  Net 1002.8 ml   Filed Weights   06/03/18 0458 06/04/18 0610 06/05/18 1130  Weight: 58.1 kg 58 kg 55.4 kg    Examination:  General: Well appearing, no distress Psych: sleepy    Data Reviewed: I have personally reviewed following labs and imaging studies  CBC: No results for input(s): WBC, NEUTROABS, HGB, HCT, MCV, PLT in the last 168 hours. Basic Metabolic Panel: No results for input(s): NA, K, CL, CO2, GLUCOSE, BUN, CREATININE, CALCIUM, MG, PHOS in the last 168 hours. GFR: Estimated Creatinine Clearance: 50.7 mL/min (by C-G formula based on SCr of 0.55 mg/dL). Liver Function Tests: No results for input(s): AST, ALT, ALKPHOS, BILITOT, PROT, ALBUMIN in the last 168 hours. No results for input(s): LIPASE, AMYLASE in the last 168 hours. No results for input(s): AMMONIA in the last 168 hours. Coagulation Profile: No results for input(s): INR, PROTIME in the last 168 hours. Cardiac Enzymes: No results for input(s): CKTOTAL, CKMB, CKMBINDEX, TROPONINI in the last 168 hours. BNP (last 3 results) No results for input(s): PROBNP in the last 8760 hours. HbA1C: No results for input(s): HGBA1C in the last 72 hours. CBG: Recent Labs  Lab 05/31/18 0014  GLUCAP 103*   Lipid Profile: No results for input(s): CHOL, HDL, LDLCALC, TRIG, CHOLHDL, LDLDIRECT in the last 72 hours. Thyroid Function Tests: No results for input(s): TSH, T4TOTAL, FREET4, T3FREE, THYROIDAB in  the last 72 hours. Anemia Panel: No results for input(s): VITAMINB12, FOLATE, FERRITIN, TIBC, IRON, RETICCTPCT in the last 72 hours. Sepsis Labs: No results for input(s): PROCALCITON, LATICACIDVEN in the last 168 hours.  No results found for this or any previous visit (from the past 240 hour(s)).        Radiology Studies: No results found.      Scheduled Meds: . cholecalciferol  2,000 Units Oral Daily  . clonazePAM  0.5 mg Oral Daily  . divalproex  250 mg Oral Q12H  . enoxaparin (LOVENOX) injection  40 mg Subcutaneous Daily  . escitalopram  10 mg Oral Daily  . feeding supplement (ENSURE ENLIVE)  237 mL Oral BID BM  . mouth rinse  15 mL Mouth Rinse BID  . OLANZapine  2.5 mg Oral Daily   And  . OLANZapine  5 mg Oral QHS   Continuous Infusions: . dextrose 5 % and 0.45 % NaCl with KCl 10 mEq/L 50 mL/hr at 06/06/18 1100     LOS: 19 days     Verneita Griffes, MD [facility] 4:39 PM  If 7PM-7AM, please contact night-coverage [email or web address]

## 2018-06-07 LAB — CBC WITH DIFFERENTIAL/PLATELET
Abs Immature Granulocytes: 0.06 10*3/uL (ref 0.00–0.07)
BASOS ABS: 0.1 10*3/uL (ref 0.0–0.1)
BASOS PCT: 1 %
EOS ABS: 0.1 10*3/uL (ref 0.0–0.5)
EOS PCT: 3 %
HCT: 42.1 % (ref 36.0–46.0)
Hemoglobin: 12.8 g/dL (ref 12.0–15.0)
IMMATURE GRANULOCYTES: 1 %
Lymphocytes Relative: 46 %
Lymphs Abs: 2.3 10*3/uL (ref 0.7–4.0)
MCH: 29.6 pg (ref 26.0–34.0)
MCHC: 30.4 g/dL (ref 30.0–36.0)
MCV: 97.5 fL (ref 80.0–100.0)
Monocytes Absolute: 0.7 10*3/uL (ref 0.1–1.0)
Monocytes Relative: 14 %
NRBC: 0 % (ref 0.0–0.2)
Neutro Abs: 1.7 10*3/uL (ref 1.7–7.7)
Neutrophils Relative %: 35 %
PLATELETS: 220 10*3/uL (ref 150–400)
RBC: 4.32 MIL/uL (ref 3.87–5.11)
RDW: 14.6 % (ref 11.5–15.5)
WBC: 5 10*3/uL (ref 4.0–10.5)

## 2018-06-07 LAB — BASIC METABOLIC PANEL
ANION GAP: 8 (ref 5–15)
BUN: 13 mg/dL (ref 8–23)
CALCIUM: 8.5 mg/dL — AB (ref 8.9–10.3)
CO2: 29 mmol/L (ref 22–32)
CREATININE: 0.7 mg/dL (ref 0.44–1.00)
Chloride: 106 mmol/L (ref 98–111)
Glucose, Bld: 97 mg/dL (ref 70–99)
Potassium: 4.5 mmol/L (ref 3.5–5.1)
Sodium: 143 mmol/L (ref 135–145)

## 2018-06-07 MED ORDER — HALOPERIDOL LACTATE 5 MG/ML IJ SOLN
2.0000 mg | Freq: Four times a day (QID) | INTRAMUSCULAR | Status: DC | PRN
  Administered 2018-06-07 – 2018-06-13 (×7): 2 mg via INTRAVENOUS
  Filled 2018-06-07 (×7): qty 1

## 2018-06-07 NOTE — Progress Notes (Signed)
agtitated today-given haldol x 1-now a little calmer Not eating much Awaiting PASSAR etc

## 2018-06-07 NOTE — NC FL2 (Signed)
[place] LEVEL OF CARE SCREENING TOOL     IDENTIFICATION  Patient Name: Gina Edwards Birthdate: March 25, 1941 Sex: female Admission Date (Current Location): 05/12/2018  [facility] and [place] Number:  Herbalist and Address:  [facility],  [place] [address] [place]      Provider Number: [id number]  Attending Physician Name and Address:  Nita Sells, MD  Relative Name and Phone Number:       Current Level of Care: Hospital Recommended Level of Care: [place] Prior Approval Number:    Date Approved/Denied:   PASRR Number: pending  Discharge Plan: SNF    Current Diagnoses: Patient Active Problem List   Diagnosis Date Noted  . Evaluation by psychiatric service required   . Acute lower UTI 05/15/2018  . Sinusitis 05/15/2018  . UTI (urinary tract infection) 05/15/2018  . Pansinusitis 05/15/2018  . Aggressive behavior   . Dementia with behavioral disturbance ([place]) 12/02/2012  . Depression 12/02/2012  . Anxiety 12/02/2012  . H/O meningioma of the brain 12/02/2012    Orientation RESPIRATION BLADDER Height & Weight     Self  Normal Incontinent, External catheter Weight: 128 lb 8.5 oz (58.3 kg) Height:     BEHAVIORAL SYMPTOMS/MOOD NEUROLOGICAL BOWEL NUTRITION STATUS      Incontinent Diet(heart healthy diet)  AMBULATORY STATUS COMMUNICATION OF NEEDS Skin   Limited Assist Verbally Normal                       Personal Care Assistance Level of Assistance  Bathing, Feeding, Dressing Bathing Assistance: Limited assistance Feeding assistance: Independent Dressing Assistance: Limited assistance     Functional Limitations Info  Sight, Hearing, Speech Sight Info: Adequate Hearing Info: Adequate Speech Info: Adequate    SPECIAL CARE FACTORS FREQUENCY  PT (By licensed PT), OT (By licensed OT)     PT Frequency: 5x OT Frequency: 5x            Contractures Contractures Info: Not present     Additional Factors Info  Code Status, Allergies Code Status Info: DNR Allergies Info: Ciprofloxacin, Morphine And Related           Current Medications (06/07/2018):  This is the current hospital active medication list Current Facility-Administered Medications  Medication Dose Route Frequency Provider Last Rate Last Dose  . acetaminophen (TYLENOL) tablet 650 mg  650 mg Oral Q6H PRN Doutova, Anastassia, MD       Or  . acetaminophen (TYLENOL) suppository 650 mg  650 mg Rectal Q6H PRN Doutova, Anastassia, MD      . cholecalciferol (VITAMIN D) tablet 2,000 Units  2,000 Units Oral Daily Toy Baker, MD   2,000 Units at 06/07/18 0942  . clonazePAM (KLONOPIN) tablet 0.5 mg  0.5 mg Oral Daily Doutova, Anastassia, MD   0.5 mg at 06/07/18 0942  . dextrose 5 % and 0.45 % NaCl with KCl 10 mEq/L infusion   Intravenous Continuous Nita Sells, MD 50 mL/hr at 06/07/18 0600    . divalproex (DEPAKOTE SPRINKLE) capsule 250 mg  250 mg Oral Q12H Patrecia Pour, MD   250 mg at 06/07/18 0942  . enoxaparin (LOVENOX) injection 40 mg  40 mg Subcutaneous Daily Doutova, Anastassia, MD   40 mg at 06/07/18 0942  . escitalopram (LEXAPRO) tablet 10 mg  10 mg Oral Daily Toy Baker, MD   10 mg at 06/07/18 0942  . feeding supplement (ENSURE ENLIVE) (ENSURE ENLIVE) liquid 237 mL  237  mL Oral BID BM Patrecia Pour, MD   237 mL at 06/07/18 0943  . HYDROcodone-acetaminophen (NORCO/VICODIN) 5-325 MG per tablet 1-2 tablet  1-2 tablet Oral Q4H PRN Toy Baker, MD   1 tablet at 06/02/18 2145  . LORazepam (ATIVAN) injection 1 mg  1 mg Intravenous Q6H PRN Patrecia Pour, MD   1 mg at 06/01/18 1231  . LORazepam (ATIVAN) tablet 1 mg  1 mg Oral Q6H PRN Patrecia Pour, MD   1 mg at 06/04/18 1709  . MEDLINE mouth rinse  15 mL Mouth Rinse BID Toy Baker, MD   15 mL at 06/07/18 0943  . OLANZapine (ZYPREXA) tablet 2.5 mg  2.5 mg Oral Daily Nita Sells, MD   2.5 mg at 06/07/18 0942   And   . OLANZapine (ZYPREXA) tablet 5 mg  5 mg Oral QHS Nita Sells, MD   5 mg at 06/06/18 2110  . ondansetron (ZOFRAN) tablet 4 mg  4 mg Oral Q6H PRN Doutova, Anastassia, MD       Or  . ondansetron (ZOFRAN) injection 4 mg  4 mg Intravenous Q6H PRN Doutova, Anastassia, MD      . polyethylene glycol (MIRALAX / GLYCOLAX) packet 17 g  17 g Oral Daily PRN Toy Baker, MD   17 g at 05/17/18 1050     Discharge Medications: Please see discharge summary for a list of discharge medications.  Relevant Imaging Results:  Relevant Lab Results:   Additional Information SS# [id number]  Nila Nephew, LCSW

## 2018-06-07 NOTE — Care Management Important Message (Signed)
Important Message  Patient Details  Name: Gina Edwards MRN: [id number] Date of Birth: 24-Nov-1940   Medicare Important Message Given:  Yes    Kerin Salen 06/07/2018, 10:33 AMImportant Message  Patient Details  Name: Gina Edwards MRN: [id number] Date of Birth: 1941/05/12   Medicare Important Message Given:  Yes    Kerin Salen 06/07/2018, 10:33 AM

## 2018-06-08 NOTE — Progress Notes (Signed)
PT Cancellation Note  Patient Details Name: Gina Edwards MRN: [id number] DOB: 02/07/1941   Cancelled Treatment:     RN reports pt is still very confused and at time combative.  Currently sleeping  Pt has been evaluated.  See PT eval for details.    Rica Koyanagi  PTA Acute  Rehabilitation Services Pager      [phone] Office      [phone]

## 2018-06-08 NOTE — Progress Notes (Signed)
Pt was given ativan two times today for agitation and trying to get out of the  Bed. She has almost bitten her  hand mittens off. She has flights of ideas that have nothing to do with reality. The ativan is helpful in keeping he calm

## 2018-06-08 NOTE — Progress Notes (Signed)
PROGRESS NOTE    [name]  YSA:[id number] DOB: 08-27-1940 DOA: 05/12/2018 PCP: Shon Baton, MD   Brief Narrative: Gina Edwards is a 77 y.o. female with a history of dementia, bipolar disorder that presented secondary to agitation and aggressive behavior. Initial plan for inpatient behavioral health admission, however, patient now stable for discharge to ALF per psych re-assessment 10/21   Assessment & Plan:   Principal Problem:   Dementia with behavioral disturbance ([place]) Active Problems:   Aggressive behavior   Acute lower UTI   Sinusitis   UTI (urinary tract infection)   Pansinusitis   Evaluation by psychiatric service required   Dementia with behavioral disturbance Continues to be less combative--sitter to d/c at 16:00 -Psychiatry recommendations: no longer requiring inpatient behavioral health admission -Continue Zyprexa, depakote, lexapro, ativan prn -Plan for ALF discharge when bed found--SW getting PASSR -30 day note signed -await input from SNF and SW  Klebsiella UTI Treated with ceftriaxone/Keflex. Completed course.  Poor oral intake In setting of acute psychosis. Improved slightly. -Encourage oral intake -IV fluids -Daily weight  Decreased urine output Resolved.  Pansinusitis Concern for chronic sinusitis vs fungal ball. Recommendation for ENT follow-up  PPD placement Placed on 06/02/18   DVT prophylaxis: Lovenox Code Status:   Code Status: DNR Family Communication: non avail Disposition Plan: Discharge to ALF when bed available and pending PT eval. Medical stable.   Consultants:   Psychiatry  Procedures:   None  Antimicrobials:  Keflex (10/2>>10/4; 10/7>>10/11)  Ceftriaxone (10/5>>10/7)    Subjective:  About the same didn't want food No fever no chills No overt other issue  Objective: Vitals:   06/07/18 2212 06/08/18 0617 06/08/18 0738 06/08/18 1308  BP: (!) 140/58 (!) 142/62 (!) 156/93 119/90  Pulse: 66 70 90 (!) 112    Resp: 14 16    Temp: 98.6 F (37 C) 98.8 F (37.1 C) 98.3 F (36.8 C) (!) 97.5 F (36.4 C)  TempSrc: Oral Axillary Axillary Axillary  SpO2: 100% 98% 99% 100%  Weight:  58.6 kg      Intake/Output Summary (Last 24 hours) at 06/08/2018 1455 Last data filed at 06/08/2018 1413 Gross per 24 hour  Intake 1317.4 ml  Output 1800 ml  Net -482.6 ml   Filed Weights   06/05/18 1130 06/07/18 0440 06/08/18 0617  Weight: 55.4 kg 58.3 kg 58.6 kg    Examination:  General: Well appearing, no distress Psych: sleepy s1 s 2 no m abd soft No breakdown    Data Reviewed: I have personally reviewed following labs and imaging studies  CBC: Recent Labs  Lab 06/07/18 0520  WBC 5.0  NEUTROABS 1.7  HGB 12.8  HCT 42.1  MCV 97.5  PLT 323   Basic Metabolic Panel: Recent Labs  Lab 06/07/18 0520  NA 143  K 4.5  CL 106  CO2 29  GLUCOSE 97  BUN 13  CREATININE 0.70  CALCIUM 8.5*   GFR: Estimated Creatinine Clearance: 53.6 mL/min (by C-G formula based on SCr of 0.7 mg/dL). Liver Function Tests: No results for input(s): AST, ALT, ALKPHOS, BILITOT, PROT, ALBUMIN in the last 168 hours. No results for input(s): LIPASE, AMYLASE in the last 168 hours. No results for input(s): AMMONIA in the last 168 hours. Coagulation Profile: No results for input(s): INR, PROTIME in the last 168 hours. Cardiac Enzymes: No results for input(s): CKTOTAL, CKMB, CKMBINDEX, TROPONINI in the last 168 hours. BNP (last 3 results) No results for input(s): PROBNP in the last 8760 hours.  HbA1C: No results for input(s): HGBA1C in the last 72 hours. CBG: No results for input(s): GLUCAP in the last 168 hours. Lipid Profile: No results for input(s): CHOL, HDL, LDLCALC, TRIG, CHOLHDL, LDLDIRECT in the last 72 hours. Thyroid Function Tests: No results for input(s): TSH, T4TOTAL, FREET4, T3FREE, THYROIDAB in the last 72 hours. Anemia Panel: No results for input(s): VITAMINB12, FOLATE, FERRITIN, TIBC, IRON,  RETICCTPCT in the last 72 hours. Sepsis Labs: No results for input(s): PROCALCITON, LATICACIDVEN in the last 168 hours.  No results found for this or any previous visit (from the past 240 hour(s)).       Radiology Studies: No results found.      Scheduled Meds: . cholecalciferol  2,000 Units Oral Daily  . clonazePAM  0.5 mg Oral Daily  . divalproex  250 mg Oral Q12H  . enoxaparin (LOVENOX) injection  40 mg Subcutaneous Daily  . escitalopram  10 mg Oral Daily  . feeding supplement (ENSURE ENLIVE)  237 mL Oral BID BM  . mouth rinse  15 mL Mouth Rinse BID  . OLANZapine  2.5 mg Oral Daily   And  . OLANZapine  5 mg Oral QHS   Continuous Infusions: . dextrose 5 % and 0.45 % NaCl with KCl 10 mEq/L 50 mL/hr at 06/08/18 0600     LOS: 21 days     Verneita Griffes, MD [facility] 2:55 PM  If 7PM-7AM, please contact night-coverage [email or web address]

## 2018-06-08 NOTE — Progress Notes (Signed)
Nutrition Follow-up  INTERVENTION:   Continue Ensure Enlive po BID, each supplement provides 350 kcal and 20 grams of protein  NUTRITION DIAGNOSIS:   Inadequate oral intake related to (AMS, dementia) as evidenced by meal completion < 50%.  Ongoing.  GOAL:   Patient will meet greater than or equal to 90% of their needs  Not meeting.  MONITOR:   PO intake, Supplement acceptance, Labs, Weight trends, I & O's  ASSESSMENT:   77 y.o. female with a history of dementia, anxiety, and depression who presented from home with agitation and progressively worsening aggressive behavior  Patient continues to be agitated and confused today. Received a dose of Haldol yesterday and Ativan today for agitation. Suspect this affects her PO intakes. PO intakes 10/28 were 25% of breakfast and dinner. Per records, pt accepting 1 Ensure most days.   Weight is still stable.  Labs reviewed. Medications: Vitamin D tablet daily, D5 and .45% NaCl w/ KCl infusion at 50 ml/hr  Diet Order:   Diet Order            Diet Heart Room service appropriate? Yes; Fluid consistency: Thin  Diet effective now              EDUCATION NEEDS:   Not appropriate for education at this time  Skin:  Skin Assessment: Reviewed RN Assessment  Last BM:  10/27  Height:   Ht Readings from Last 1 Encounters:  02/06/18 5\' 6"  (1.676 m)    Weight:   Wt Readings from Last 1 Encounters:  06/08/18 58.6 kg    Ideal Body Weight:  59.1 kg   BMI:  Body mass index is 20.85 kg/m.  Estimated Nutritional Needs:   Kcal:  8270-7867  Protein:  70-80g  Fluid:  1.6L/day  Clayton Bibles, MS, RD, LDN [place] Dietitian Pager: [phone] After Hours Pager: [phone]

## 2018-06-08 NOTE — Progress Notes (Signed)
Obtained approved PASRR #[id number] H Discussed with facility representative ([facility]) and with son. Once pt's behavior calm/managable without supervision/sitter/restraints, facility able to proceed with admission. Will follow.   Sharren Bridge, MSW, LCSW Clinical Social Work 06/08/2018 [phone]

## 2018-06-08 NOTE — Progress Notes (Signed)
Occupational Therapy Treatment Patient Details Name: Gina Edwards MRN: [id number] DOB: 27-Apr-1941 Today's Date: 06/08/2018    History of present illness Pt was admitted for dementia with behavioral disturbance, Dx of UTI. PMH: anxiety and depression   OT comments  Pt making little to no progress this session. Pt very agitated and confused.  Bed soaked and pt had bowel movement.  Addressed bed mobility and transfers in the setting of cleaning pt and changing bed linens. Pt required mod A x2 for most mobility and was not coherent with speech today. Pt did follow a few simple commands.    Follow Up Recommendations  SNF;Supervision/Assistance - 24 hour    Equipment Recommendations  3 in 1 bedside commode    Recommendations for Other Services      Precautions / Restrictions Precautions Precautions: Fall Precaution Comments: aggressive behavior at times including hitting, biting, spitting Restrictions Weight Bearing Restrictions: No       Mobility Bed Mobility Overal bed mobility: Needs Assistance Bed Mobility: Supine to Sit;Sit to Supine     Supine to sit: Mod assist Sit to supine: Mod assist   General bed mobility comments: requires assist going down to elbow and getting legs into the bed.  Transfers Overall transfer level: Needs assistance Equipment used: 2 person hand held assist Transfers: Sit to/from Stand Sit to Stand: Mod assist;+2 physical assistance;+2 safety/equipment         General transfer comment: Pt took 3 small lateral steps with max assist x1 to move to top of bed.    Balance Overall balance assessment: Needs assistance Sitting-balance support: Feet supported;Bilateral upper extremity supported Sitting balance-Leahy Scale: Fair     Standing balance support: During functional activity;Bilateral upper extremity supported Standing balance-Leahy Scale: Poor Standing balance comment: posterior lean with knees flexed in standing very narrow base of  stance                           ADL either performed or assessed with clinical judgement   ADL Overall ADL's : Needs assistance/impaired                     Lower Body Dressing: Maximal assistance;Sit to/from stand Lower Body Dressing Details (indicate cue type and reason): Underwear donned in bed dependently.  Pt stood with max assist to pull it up. Toilet Transfer: +2 for physical assistance;Moderate assistance;Stand-pivot;BSC   Toileting- Clothing Manipulation and Hygiene: Maximal assistance;+2 for physical assistance;Sit to/from stand Toileting - Clothing Manipulation Details (indicate cue type and reason): one person stood with person while second cleaned pt.     Functional mobility during ADLs: Moderate assistance;+2 for physical assistance;Cueing for safety;Cueing for sequencing General ADL Comments: Pt able to stand with +1 max assist for appx 1 minute while tech changed sheets on bed.     Vision       Perception     Praxis      Cognition Arousal/Alertness: Awake/alert Behavior During Therapy: Agitated Overall Cognitive Status: No family/caregiver present to determine baseline cognitive functioning                                 General Comments: Pt did follow a few simple commands for bed mobility.  Attempting to converse but unable to make sense.        Exercises     Shoulder Instructions  General Comments Pt very agitated today.     Pertinent Vitals/ Pain       Pain Assessment: Faces Faces Pain Scale: No hurt  Home Living                                          Prior Functioning/Environment              Frequency  Min 2X/week        Progress Toward Goals  OT Goals(current goals can now be found in the care plan section)  Progress towards OT goals: Not progressing toward goals - comment(agitated today)  Acute Rehab OT Goals Patient Stated Goal: none stated OT Goal Formulation:  Patient unable to participate in goal setting Time For Goal Achievement: 06/22/18 Potential to Achieve Goals: Fair ADL Goals Pt Will Perform Grooming: with set-up;sitting Pt Will Transfer to Toilet: with min guard assist;stand pivot transfer;bedside commode Additional ADL Goal #1: pt will go from sit to stand with min guard assistance and maintain for 2 minutes for adls  Plan Discharge plan remains appropriate    Co-evaluation                 AM-PAC PT "6 Clicks" Daily Activity     Outcome Measure   Help from another person eating meals?: Total Help from another person taking care of personal grooming?: A Lot Help from another person toileting, which includes using toliet, bedpan, or urinal?: Total Help from another person bathing (including washing, rinsing, drying)?: Total Help from another person to put on and taking off regular upper body clothing?: A Lot Help from another person to put on and taking off regular lower body clothing?: Total 6 Click Score: 8    End of Session    OT Visit Diagnosis: Unsteadiness on feet (R26.81)   Activity Tolerance Treatment limited secondary to agitation   Patient Left in bed;with call bell/phone within reach;with bed alarm set;with nursing/sitter in room;with restraints reapplied   Nurse Communication          Time: 9678-9381 OT Time Calculation (min): 22 min  Charges: OT General Charges $OT Visit: 1 Visit OT Treatments $Self Care/Home Management : 8-22 mins  Jinger Neighbors, OTR/L [phone]   Glenford Peers 06/08/2018, 11:45 AM

## 2018-06-09 DIAGNOSIS — F0151 Vascular dementia with behavioral disturbance: Secondary | ICD-10-CM

## 2018-06-09 MED ORDER — DIVALPROEX SODIUM 125 MG PO CSDR
250.0000 mg | DELAYED_RELEASE_CAPSULE | Freq: Every day | ORAL | Status: DC
  Administered 2018-06-10 – 2018-06-16 (×6): 250 mg via ORAL
  Filled 2018-06-09 (×9): qty 2

## 2018-06-09 MED ORDER — DIVALPROEX SODIUM 125 MG PO CSDR
500.0000 mg | DELAYED_RELEASE_CAPSULE | Freq: Every day | ORAL | Status: DC
  Administered 2018-06-09 – 2018-06-17 (×9): 500 mg via ORAL
  Filled 2018-06-09 (×9): qty 4

## 2018-06-09 NOTE — Consult Note (Addendum)
[facility] Psych Consult Progress Note  06/09/2018 12:11 PM CORRISSA MARTELLO  MRN:  [id number] Subjective:  Ms. Mellette was last seen by the psychiatry consult service on 10/21. She was psychiatrically cleared. She is unable to discharge to SNF at this time due to worsening agitation requiring hand mitts. Zyprexa was last increased to 7.5 mg daily on 10/15. Depakote was started on 10/8. She was given Ativan 1 mg ( x 2 doses on 10/29) for agitation. Attempted to evaluate patient yesterday but she was extremely sedated from Ativan and unable to awake her.   On interview, Ms. Caradonna is mostly nonsensical when responding to questions. She denies SI, HI or AVH. She denies problems with sleep or appetite. She denies somatic complaints. She is only oriented to self.    Principal Problem: Dementia with behavioral disturbance ([place]) Diagnosis:   Patient Active Problem List   Diagnosis Date Noted  . Evaluation by psychiatric service required [Z00.8]   . Acute lower UTI [N39.0] 05/15/2018  . Sinusitis [J32.9] 05/15/2018  . UTI (urinary tract infection) [N39.0] 05/15/2018  . Pansinusitis [J32.4] 05/15/2018  . Aggressive behavior [R46.89]   . Dementia with behavioral disturbance ([place]) [F03.91] 12/02/2012  . Depression [F32.9] 12/02/2012  . Anxiety [F41.9] 12/02/2012  . H/O meningioma of the brain [Z86.011] 12/02/2012   Total Time spent with patient: 15 minutes  Past Psychiatric History: Dementia, depression and anxiety.   Past Medical History:  Past Medical History:  Diagnosis Date  . Anxiety 12/02/2012  . Anxiety and depression   . Dementia ([place]) 12/02/2012  . Depression 12/02/2012  . H/O meningioma of the brain 12/02/2012  . SUI (stress urinary incontinence, female)     Past Surgical History:  Procedure Laterality Date  . APPENDECTOMY  1957  . TUBAL LIGATION Bilateral 1975   Family History:  Family History  Problem Relation Age of Onset  . Heart disease Mother   . Colon cancer Mother 4  .  Prostate cancer Father    Family Psychiatric  History: Unknown  Social History:  Social History   Substance and Sexual Activity  Alcohol Use No     Social History   Substance and Sexual Activity  Drug Use No    Social History   Socioeconomic History  . Marital status: Married    Spouse name: Not on file  . Number of children: Not on file  . Years of education: Not on file  . Highest education level: Not on file  Occupational History  . Not on file  Social Needs  . Financial resource strain: Not on file  . Food insecurity:    Worry: Not on file    Inability: Not on file  . Transportation needs:    Medical: Not on file    Non-medical: Not on file  Tobacco Use  . Smoking status: Never Smoker  . Smokeless tobacco: Never Used  Substance and Sexual Activity  . Alcohol use: No  . Drug use: No  . Sexual activity: Yes    Partners: Male    Birth control/protection: Post-menopausal  Lifestyle  . Physical activity:    Days per week: Not on file    Minutes per session: Not on file  . Stress: Not on file  Relationships  . Social connections:    Talks on phone: Not on file    Gets together: Not on file    Attends religious service: Not on file    Active member of club or organization: Not on file  Attends meetings of clubs or organizations: Not on file    Relationship status: Not on file  Other Topics Concern  . Not on file  Social History Narrative   Pt live at home with spouse.    Caffeine Use- 24oz    Sleep: Fair  Appetite:  Poor  Current Medications: Current Facility-Administered Medications  Medication Dose Route Frequency Provider Last Rate Last Dose  . acetaminophen (TYLENOL) tablet 650 mg  650 mg Oral Q6H PRN Toy Baker, MD       Or  . acetaminophen (TYLENOL) suppository 650 mg  650 mg Rectal Q6H PRN Doutova, Anastassia, MD      . cholecalciferol (VITAMIN D) tablet 2,000 Units  2,000 Units Oral Daily Toy Baker, MD   2,000 Units at  06/08/18 1059  . clonazePAM (KLONOPIN) tablet 0.5 mg  0.5 mg Oral Daily Doutova, Anastassia, MD   0.5 mg at 06/08/18 1059  . dextrose 5 % and 0.45 % NaCl with KCl 10 mEq/L infusion   Intravenous Continuous Nita Sells, MD 50 mL/hr at 06/08/18 1815    . divalproex (DEPAKOTE SPRINKLE) capsule 250 mg  250 mg Oral Q12H Patrecia Pour, MD   250 mg at 06/08/18 2354  . enoxaparin (LOVENOX) injection 40 mg  40 mg Subcutaneous Daily Doutova, Anastassia, MD   40 mg at 06/08/18 1059  . escitalopram (LEXAPRO) tablet 10 mg  10 mg Oral Daily Doutova, Anastassia, MD   10 mg at 06/08/18 1059  . feeding supplement (ENSURE ENLIVE) (ENSURE ENLIVE) liquid 237 mL  237 mL Oral BID BM Vance Gather B, MD   237 mL at 06/07/18 0943  . haloperidol lactate (HALDOL) injection 2 mg  2 mg Intravenous Q6H PRN Nita Sells, MD   2 mg at 06/07/18 1103  . HYDROcodone-acetaminophen (NORCO/VICODIN) 5-325 MG per tablet 1-2 tablet  1-2 tablet Oral Q4H PRN Toy Baker, MD   1 tablet at 06/02/18 2145  . LORazepam (ATIVAN) injection 1 mg  1 mg Intravenous Q6H PRN Patrecia Pour, MD   1 mg at 06/01/18 1231  . LORazepam (ATIVAN) tablet 1 mg  1 mg Oral Q6H PRN Patrecia Pour, MD   1 mg at 06/08/18 1810  . MEDLINE mouth rinse  15 mL Mouth Rinse BID Doutova, Anastassia, MD   15 mL at 06/08/18 2354  . OLANZapine (ZYPREXA) tablet 2.5 mg  2.5 mg Oral Daily Nita Sells, MD   2.5 mg at 06/08/18 1109   And  . OLANZapine (ZYPREXA) tablet 5 mg  5 mg Oral QHS Nita Sells, MD   5 mg at 06/08/18 2354  . ondansetron (ZOFRAN) tablet 4 mg  4 mg Oral Q6H PRN Doutova, Anastassia, MD       Or  . ondansetron (ZOFRAN) injection 4 mg  4 mg Intravenous Q6H PRN Doutova, Anastassia, MD      . polyethylene glycol (MIRALAX / GLYCOLAX) packet 17 g  17 g Oral Daily PRN Doutova, Anastassia, MD   17 g at 05/17/18 1050    Lab Results: No results found for this or any previous visit (from the past 48 hour(s)).  Blood Alcohol level:   Lab Results  Component Value Date   ETH <10 05/12/2018    Musculoskeletal: Strength & Muscle Tone: Generalized weakness Gait & Station: unable to stand Patient leans: Backward  Psychiatric Specialty Exam: Physical Exam  Nursing note and vitals reviewed. Constitutional: She appears well-developed and well-nourished.  HENT:  Head: Normocephalic and atraumatic.  Neck: Normal  range of motion.  Respiratory: Effort normal.  Musculoskeletal: Normal range of motion.  Neurological: She is alert.  Only oriented to self.   Psychiatric: She has a normal mood and affect. Her behavior is normal. Judgment and thought content normal. Her speech is slurred. Cognition and memory are impaired.    Review of Systems  Musculoskeletal: Negative for joint pain.  Psychiatric/Behavioral: Negative for substance abuse and suicidal ideas.  All other systems reviewed and are negative.   Blood pressure (!) 148/90, pulse 88, temperature (!) 97.5 F (36.4 C), temperature source Oral, resp. rate 18, weight 58.6 kg, SpO2 98 %.Body mass index is 20.85 kg/m.  General Appearance: Fairly Groomed, elderly, Caucasian female, wearing a hospital gown with short hair who is lying in bed on her left side. NAD.   Eye Contact:  Good  Speech:  Slurred  Volume:  Normal  Mood:  Euthymic  Affect:  Congruent  Thought Process:  Linear and Descriptions of Associations: Tangential at times.  Orientation:  Other:  Only oriented to self.  Thought Content:  Illogical  Suicidal Thoughts:  No  Homicidal Thoughts:  No  Memory:  Immediate;   Poor Recent;   Fair Remote;   Fair  Judgement:  Impaired  Insight:  Lacking  Psychomotor Activity:  Decreased  Concentration:  Concentration: Fair and Attention Span: Fair  Recall:  Poor  Fund of Knowledge:  Fair  Language:  Good  Akathisia:  No  Handed:  Right  AIMS (if indicated):   N/A  Assets:  Housing Social Support  ADL's:  Impaired  Cognition: Impaired with short and long  term memory deficits.   Sleep:   Okay   Assessment:  YAILEN ZEMAITIS is a 77 y.o. female who was admitted with agitation in the setting of dementia and likely superimposed by hospital induced delirium. She continues to have intermittent agitation requiring restraints. She is calm and appropriate on interview today. Recommend adjusting Depakote and Zyprexa as needed for worsening agitation. She does not require inpatient psychiatric hospitalization at this time and is most appropriate for a SNF with a memory care unit.    Treatment Plan Summary: -Continue Zyprexa 2.5 mg q am and 5 mg qhs for agitation and poor appetite.  -Consider Zyprexa 2.5 mg daily PRN for worsening agitation.  -Continue Depakote 250 mg q am and 500 mg qhs for mood stabilization/agitation (increased on 10/30). -Continue Lexapro 10 mg daily for mood.  -Continue Klonopin 0.5 mg daily for agitation. Recommend judicious use of benzodiazepines as well as other sedating medications since risk to worsen mental status. -EKG reviewed and QTc462 on 10/5. Please closely monitor when starting or increasing QTc prolonging agents. -Will sign off on patient at this time. Please consult psychiatry again as needed.   Faythe Dingwall, DO 06/10/2018, 12:11 PM

## 2018-06-09 NOTE — Progress Notes (Signed)
PROGRESS NOTE  Gina Edwards YPP:[id number] DOB: 1941-01-20 DOA: 05/12/2018 PCP: Shon Baton, MD  HPI/Recap of past 24 hours:  She is sleepy after the ativan from yesterday  Assessment/Plan: Principal Problem:   Dementia with behavioral disturbance ([place]) Active Problems:   Aggressive behavior   Acute lower UTI   Sinusitis   UTI (urinary tract infection)   Pansinusitis   Evaluation by psychiatric service required  Dementia with behavioral disturbance -Psychiatry recommendations: no longer requiring inpatient behavioral health admission -Continue Zyprexa, depakote, lexapro, -case discussed with psych due to intermittent agitation, psych Dr Mariea Clonts recommended d/c prn ativan, increase night time depakote  Klebsiella UTI Treated with ceftriaxone/Keflex. Completed course.  Poor oral intake In setting of acute psychosis. Improved slightly. -Encourage oral intake -IV fluids -Daily weight  Decreased urine output Resolved.  Pansinusitis Concern for chronic sinusitis vs fungal ball. Recommendation for ENT follow-up  PPD placement Placed on 06/02/18   DVT prophylaxis: Lovenox Code Status:   Code Status: DNR Family Communication: patient   Disposition Plan: SNF once less agitated   Consultants:  psych  Procedures:  none  Antibiotics:  Keflex (10/2>>10/4; 10/7>>10/11)  Ceftriaxone (10/5>>10/7)    Objective: BP (!) 148/90 (BP Location: Right Leg)   Pulse 88   Temp (!) 97.5 F (36.4 C) (Oral)   Resp 18   Wt 58.6 kg   SpO2 98%   BMI 20.85 kg/m   Intake/Output Summary (Last 24 hours) at 06/09/2018 1141 Last data filed at 06/09/2018 0600 Gross per 24 hour  Intake 1180.47 ml  Output 400 ml  Net 780.47 ml   Filed Weights   06/05/18 1130 06/07/18 0440 06/08/18 0617  Weight: 55.4 kg 58.3 kg 58.6 kg    Exam: Patient is examined daily including today on 06/09/2018, exams remain the same as of yesterday except that has changed    General:   sleepy  Cardiovascular: RRR  Respiratory: CTABL  Abdomen: Soft/ND/NT, positive BS  Musculoskeletal: No Edema  Neuro: sleepy  Data Reviewed: Basic Metabolic Panel: Recent Labs  Lab 06/07/18 0520  NA 143  K 4.5  CL 106  CO2 29  GLUCOSE 97  BUN 13  CREATININE 0.70  CALCIUM 8.5*   Liver Function Tests: No results for input(s): AST, ALT, ALKPHOS, BILITOT, PROT, ALBUMIN in the last 168 hours. No results for input(s): LIPASE, AMYLASE in the last 168 hours. No results for input(s): AMMONIA in the last 168 hours. CBC: Recent Labs  Lab 06/07/18 0520  WBC 5.0  NEUTROABS 1.7  HGB 12.8  HCT 42.1  MCV 97.5  PLT 220   Cardiac Enzymes:   No results for input(s): CKTOTAL, CKMB, CKMBINDEX, TROPONINI in the last 168 hours. BNP (last 3 results) No results for input(s): BNP in the last 8760 hours.  ProBNP (last 3 results) No results for input(s): PROBNP in the last 8760 hours.  CBG: No results for input(s): GLUCAP in the last 168 hours.  No results found for this or any previous visit (from the past 240 hour(s)).   Studies: No results found.  Scheduled Meds: . cholecalciferol  2,000 Units Oral Daily  . clonazePAM  0.5 mg Oral Daily  . divalproex  250 mg Oral Q12H  . enoxaparin (LOVENOX) injection  40 mg Subcutaneous Daily  . escitalopram  10 mg Oral Daily  . feeding supplement (ENSURE ENLIVE)  237 mL Oral BID BM  . mouth rinse  15 mL Mouth Rinse BID  . OLANZapine  2.5 mg Oral Daily  And  . OLANZapine  5 mg Oral QHS    Continuous Infusions: . dextrose 5 % and 0.45 % NaCl with KCl 10 mEq/L 50 mL/hr at 06/08/18 1815     Time spent: 60mins, case discussed with psych and social worker I have personally reviewed and interpreted on  06/09/2018 daily labs, imagings as discussed above under date review session and assessment and plans.  I reviewed all nursing notes, pharmacy notes, consultant notes,  vitals, pertinent old records  I have discussed plan of care as  described above with RN , patient  on 06/09/2018   Florencia Reasons MD, PhD  [facility] Pager [phone]. If 7PM-7AM, please contact night-coverage at [email or web address], password [facility] 06/09/2018, 11:41 AM  LOS: 22 days

## 2018-06-10 LAB — COMPREHENSIVE METABOLIC PANEL
ALT: 19 U/L (ref 0–44)
ANION GAP: 11 (ref 5–15)
AST: 25 U/L (ref 15–41)
Albumin: 3.3 g/dL — ABNORMAL LOW (ref 3.5–5.0)
Alkaline Phosphatase: 95 U/L (ref 38–126)
BUN: 14 mg/dL (ref 8–23)
CO2: 27 mmol/L (ref 22–32)
CREATININE: 0.6 mg/dL (ref 0.44–1.00)
Calcium: 8.9 mg/dL (ref 8.9–10.3)
Chloride: 104 mmol/L (ref 98–111)
GLUCOSE: 85 mg/dL (ref 70–99)
POTASSIUM: 4.7 mmol/L (ref 3.5–5.1)
Sodium: 142 mmol/L (ref 135–145)
Total Bilirubin: 1.1 mg/dL (ref 0.3–1.2)
Total Protein: 6.6 g/dL (ref 6.5–8.1)

## 2018-06-10 LAB — CBC WITH DIFFERENTIAL/PLATELET
Abs Immature Granulocytes: 0.05 10*3/uL (ref 0.00–0.07)
BASOS PCT: 1 %
Basophils Absolute: 0.1 10*3/uL (ref 0.0–0.1)
EOS ABS: 0.1 10*3/uL (ref 0.0–0.5)
Eosinophils Relative: 1 %
HCT: 47.1 % — ABNORMAL HIGH (ref 36.0–46.0)
Hemoglobin: 14.5 g/dL (ref 12.0–15.0)
Immature Granulocytes: 1 %
Lymphocytes Relative: 35 %
Lymphs Abs: 2.4 10*3/uL (ref 0.7–4.0)
MCH: 30 pg (ref 26.0–34.0)
MCHC: 30.8 g/dL (ref 30.0–36.0)
MCV: 97.3 fL (ref 80.0–100.0)
MONO ABS: 1 10*3/uL (ref 0.1–1.0)
MONOS PCT: 15 %
NEUTROS PCT: 47 %
Neutro Abs: 3.2 10*3/uL (ref 1.7–7.7)
Platelets: 218 10*3/uL (ref 150–400)
RBC: 4.84 MIL/uL (ref 3.87–5.11)
RDW: 14.2 % (ref 11.5–15.5)
WBC: 6.8 10*3/uL (ref 4.0–10.5)
nRBC: 0 % (ref 0.0–0.2)

## 2018-06-10 LAB — MAGNESIUM: MAGNESIUM: 2 mg/dL (ref 1.7–2.4)

## 2018-06-10 MED ORDER — OLANZAPINE 5 MG PO TABS
2.5000 mg | ORAL_TABLET | Freq: Every day | ORAL | Status: DC | PRN
  Filled 2018-06-10: qty 1

## 2018-06-10 NOTE — Progress Notes (Signed)
CSW continues to assist with disposition- plan to admit to [facility], however current barrier is that facility advising pt's mental status will need to stabilize in order to plan to participate in rehab. See hx of behaviors/barriers, pt's combativeness/agitation much improved per staff report, however medications leading to pt being very sedated.  Long term plan remains for pt to admit to memory care ALF (family working with [facility]) once able to regain functional status (PTA was fairly independent with ability to perform ADLs with prompting and with ambulating). CSW discussed pt's situation with son Gina Edwards today. Gina Edwards remains understanding of barriers and hopeful for plan above. Willing to consider any options needed for pt's care. States if pt does not improve they are considering what needs would be if pt admitted to SNF as long term care resident (at locked unit facility) or returned home with 24/7 care givers.  Updated facility ([facility]) on pt's status today. Will continue following.   Sharren Bridge, MSW, LCSW Clinical Social Work 06/10/2018 [phone]

## 2018-06-10 NOTE — Progress Notes (Signed)
PROGRESS NOTE  MARVETTE SCHAMP FVC:[id number] DOB: 1941-07-22 DOA: 05/12/2018 PCP: Shon Baton, MD  HPI/Recap of past 24 hours:  She has an uneventful night and calm day today She is awake, she does not need mittens or sitters She opens her eyes , answers "yes" to all the questions asked  No fever, labs unremarkable, urine is clear, on bm documented last 24hrs  Assessment/Plan: Principal Problem:   Dementia with behavioral disturbance ([place]) Active Problems:   Aggressive behavior   Acute lower UTI   Sinusitis   UTI (urinary tract infection)   Pansinusitis   Evaluation by psychiatric service required  Dementia with behavioral disturbance -Psychiatry recommendations: no longer requiring inpatient behavioral health admission -Continue Zyprexa, depakote, lexapro, -case discussed with psych due to intermittent agitation, psych Dr Mariea Clonts recommended d/c prn ativan, increase night time depakote Per psychiatry Dr Mariea Clonts on 10/31 "-Continue Zyprexa 2.5 mg q am and 5 mg qhs for agitation and poor appetite.  -Consider Zyprexa 2.5 mg daily PRN for worsening agitation.  -ContinueDepakote 250 mg q am and 500 mg qhs for mood stabilization/agitation (increased on 10/30). -Continue Lexapro 10 mg daily for mood.  -Continue Klonopin 0.5 mg daily for agitation. Recommend judicious use of benzodiazepines as well as other sedating medications since risk to worsen mental status. -EKG reviewed and QTc462 on 10/5. Please closely monitor when starting or increasing QTc prolonging agents."  Klebsiella UTI Treated with ceftriaxone/Keflex. Completed course.  Poor oral intake In setting of acute psychosis. Improved slightly. -Encourage oral intake -IV fluids -Daily weight  Decreased urine output Resolved.  Pansinusitis Concern for chronic sinusitis vs fungal ball. Recommendation for ENT follow-up  PPD placement Placed on 06/02/18   DVT prophylaxis: Lovenox Code Status:   Code  Status: DNR Family Communication: patient   Disposition Plan: SNF once less agitated   Consultants:  psych  Procedures:  none  Antibiotics:  Keflex (10/2>>10/4; 10/7>>10/11)  Ceftriaxone (10/5>>10/7)    Objective: BP (!) 147/92 (BP Location: Left Arm)   Pulse 89   Temp 98.1 F (36.7 C) (Oral)   Resp 16   Wt 58.6 kg   SpO2 94%   BMI 20.85 kg/m   Intake/Output Summary (Last 24 hours) at 06/10/2018 1836 Last data filed at 06/10/2018 0612 Gross per 24 hour  Intake 230.27 ml  Output 950 ml  Net -719.73 ml   Filed Weights   06/05/18 1130 06/07/18 0440 06/08/18 0617  Weight: 55.4 kg 58.3 kg 58.6 kg    Exam: Patient is examined daily including today on 06/10/2018, exams remain the same as of yesterday except that has changed    General:  Alert, no agitation  Cardiovascular: RRR  Respiratory: CTABL  Abdomen: Soft/ND/NT, positive BS  Musculoskeletal: No Edema  Neuro: alert  Data Reviewed: Basic Metabolic Panel: Recent Labs  Lab 06/07/18 0520 06/10/18 0349  NA 143 142  K 4.5 4.7  CL 106 104  CO2 29 27  GLUCOSE 97 85  BUN 13 14  CREATININE 0.70 0.60  CALCIUM 8.5* 8.9  MG  --  2.0   Liver Function Tests: Recent Labs  Lab 06/10/18 0349  AST 25  ALT 19  ALKPHOS 95  BILITOT 1.1  PROT 6.6  ALBUMIN 3.3*   No results for input(s): LIPASE, AMYLASE in the last 168 hours. No results for input(s): AMMONIA in the last 168 hours. CBC: Recent Labs  Lab 06/07/18 0520 06/10/18 0349  WBC 5.0 6.8  NEUTROABS 1.7 3.2  HGB 12.8 14.5  HCT 42.1 47.1*  MCV 97.5 97.3  PLT 220 218   Cardiac Enzymes:   No results for input(s): CKTOTAL, CKMB, CKMBINDEX, TROPONINI in the last 168 hours. BNP (last 3 results) No results for input(s): BNP in the last 8760 hours.  ProBNP (last 3 results) No results for input(s): PROBNP in the last 8760 hours.  CBG: No results for input(s): GLUCAP in the last 168 hours.  No results found for this or any previous visit  (from the past 240 hour(s)).   Studies: No results found.  Scheduled Meds: . cholecalciferol  2,000 Units Oral Daily  . clonazePAM  0.5 mg Oral Daily  . divalproex  250 mg Oral Daily  . divalproex  500 mg Oral QHS  . enoxaparin (LOVENOX) injection  40 mg Subcutaneous Daily  . escitalopram  10 mg Oral Daily  . feeding supplement (ENSURE ENLIVE)  237 mL Oral BID BM  . mouth rinse  15 mL Mouth Rinse BID  . OLANZapine  2.5 mg Oral Daily   And  . OLANZapine  5 mg Oral QHS    Continuous Infusions: . dextrose 5 % and 0.45 % NaCl with KCl 10 mEq/L 50 mL/hr at 06/09/18 1504     Time spent: 93mins, case discussed with psych and social worker I have personally reviewed and interpreted on  06/10/2018 daily labs, imagings as discussed above under date review session and assessment and plans.  I reviewed all nursing notes, pharmacy notes, consultant notes,  vitals, pertinent old records  I have discussed plan of care as described above with RN , patient  on 06/10/2018   Florencia Reasons MD, PhD  [facility] Pager [phone]. If 7PM-7AM, please contact night-coverage at [email or web address], password [facility] 06/10/2018, 6:36 PM  LOS: 23 days

## 2018-06-10 NOTE — Progress Notes (Signed)
Physical Therapy Treatment Patient Details Name: Gina Edwards MRN: [id number] DOB: 1941-05-05 Today's Date: 06/10/2018    History of Present Illness Pt was admitted for dementia with behavioral disturbance, Dx of UTI. PMH: anxiety and depression    PT Comments    Pt assisted with sit stands and transfers to [facility] a couple times.  Pt restless and not agreeable to use BSC once seated or ambulate.  Pt did take a couple bites of ice cream.  Pt requiring at least mod assist for safe mobility at this time.  Pt mostly with forward trunk lean with fatigue or attempting to reach out for objects however requiring assist for posture correction and safety.  Continue to recommend SNF at this time.   Follow Up Recommendations  SNF;Supervision/Assistance - 24 hour     Equipment Recommendations  Wheelchair (measurements PT);Wheelchair cushion (measurements PT)    Recommendations for Other Services       Precautions / Restrictions Precautions Precautions: Fall Precaution Comments: no aggressive behavior this session    Mobility  Bed Mobility Overal bed mobility: Needs Assistance Bed Mobility: Supine to Sit;Sit to Supine     Supine to sit: Mod assist Sit to supine: Mod assist   General bed mobility comments: assist for trunk upright and LEs onto bed  Transfers Overall transfer level: Needs assistance Equipment used: Rolling walker (2 wheeled) Transfers: Sit to/from Stand Sit to Stand: Mod assist;+2 physical assistance;+2 safety/equipment Stand pivot transfers: Mod assist;+2 physical assistance;+2 safety/equipment       General transfer comment: performed sit to stands and transferred to [facility] with 2 HHA, pt then provided with RW and able to place hands however easily distracted and would remove hands from walker, sit to stands performed multiple times as pt kept attempting to mobilize however then would stop movement  Ambulation/Gait             General Gait Details: pt not  participating in taking steps so deferred ambulation for safety   Stairs             Wheelchair Mobility    Modified Rankin (Stroke Patients Only)       Balance Overall balance assessment: Needs assistance Sitting-balance support: Feet supported Sitting balance-Leahy Scale: Poor Sitting balance - Comments: occasional assist with pt leaning forward, pt able to correct with tactile and verbal cues and maintain upright posture briefly   Standing balance support: Bilateral upper extremity supported;During functional activity Standing balance-Leahy Scale: Zero Standing balance comment: pt with more forward trunk lean during transfers requiring assist                            Cognition Arousal/Alertness: Awake/alert Behavior During Therapy: Restless Overall Cognitive Status: No family/caregiver present to determine baseline cognitive functioning                                 General Comments: pt occasionally following cues, will answer yes/no questions otherwise very little verbalizations; restless and fridgeting behavior      Exercises      General Comments        Pertinent Vitals/Pain Pain Assessment: Faces Faces Pain Scale: No hurt Pain Intervention(s): Repositioned;Monitored during session    Home Living                      Prior Function  PT Goals (current goals can now be found in the care plan section) Progress towards PT goals: Progressing toward goals    Frequency    Min 2X/week      PT Plan Current plan remains appropriate    Co-evaluation PT/OT/SLP Co-Evaluation/Treatment: Yes Reason for Co-Treatment: For patient/therapist safety;To address functional/ADL transfers PT goals addressed during session: Mobility/safety with mobility OT goals addressed during session: ADL's and self-care      AM-PAC PT "6 Clicks" Daily Activity  Outcome Measure  Difficulty turning over in bed (including  adjusting bedclothes, sheets and blankets)?: Unable Difficulty moving from lying on back to sitting on the side of the bed? : Unable Difficulty sitting down on and standing up from a chair with arms (e.g., wheelchair, bedside commode, etc,.)?: Unable Help needed moving to and from a bed to chair (including a wheelchair)?: Total Help needed walking in hospital room?: Total Help needed climbing 3-5 steps with a railing? : Total 6 Click Score: 6    End of Session   Activity Tolerance: Patient tolerated treatment well Patient left: in bed;with call bell/phone within reach;with bed alarm set Nurse Communication: Mobility status PT Visit Diagnosis: Unsteadiness on feet (R26.81)     Time: 9407-6808 PT Time Calculation (min) (ACUTE ONLY): 35 min  Charges:  $Therapeutic Activity: 8-22 mins                     Carmelia Bake, PT, DPT Acute Rehabilitation Services Office: [phone] Pager: [phone]  Trena Platt 06/10/2018, 4:28 PM

## 2018-06-10 NOTE — Progress Notes (Signed)
Occupational Therapy Treatment Patient Details Name: Gina Edwards MRN: [id number] DOB: 1940/08/15 Today's Date: 06/10/2018    History of present illness Pt was admitted for dementia with behavioral disturbance, Dx of UTI. PMH: anxiety and depression   OT comments  Tolerated session well, but restless.  Does participate in  ADL:s if she initiates it.  Did not participate with hand over hand except to take cup from me  Follow Up Recommendations  SNF    Equipment Recommendations  3 in 1 bedside commode    Recommendations for Other Services      Precautions / Restrictions Precautions Precautions: Fall Precaution Comments: no aggressive behavior this session Restrictions Weight Bearing Restrictions: No       Mobility Bed Mobility Overal bed mobility: Needs Assistance Bed Mobility: Supine to Sit;Sit to Supine     Supine to sit: Mod assist Sit to supine: Mod assist   General bed mobility comments: assist for trunk upright and LEs onto bed  Transfers Overall transfer level: Needs assistance Equipment used: Rolling walker (2 wheeled) Transfers: Sit to/from Stand Sit to Stand: Mod assist;+2 physical assistance;+2 safety/equipment Stand pivot transfers: Mod assist;+2 physical assistance;+2 safety/equipment       General transfer comment: performed sit to stands and transferred to [facility] with 2 HHA, pt then provided with RW and able to place hands however easily distracted and would remove hands from walker, sit to stands performed multiple times as pt kept attempting to mobilize however then would stop movement    Balance Overall balance assessment: Needs assistance Sitting-balance support: Feet supported Sitting balance-Leahy Scale: Poor Sitting balance - Comments: occasional assist with pt leaning forward, pt able to correct with tactile and verbal cues and maintain upright posture briefly   Standing balance support: Bilateral upper extremity supported;During functional  activity Standing balance-Leahy Scale: Zero Standing balance comment: pt with more forward trunk lean during transfers requiring assist                           ADL either performed or assessed with clinical judgement   ADL     Eating/Feeding Details (indicate cue type and reason): max A for cup with little intake. Pt tended to bite straw.  Total A for ice cream. Let go with hand over hand assistance   Grooming Details (indicate cue type and reason): washed hands with set up; total A for comb, lotion                 Toilet Transfer: +2 for physical assistance;+2 for safety/equipment;Stand-pivot;BSC;RW(mod to max +2; variable) Toilet Transfer Details (indicate cue type and reason): x 3 times           General ADL Comments: pt restless. Stood many times with +2 safety and variable participation     Technical brewer Arousal/Alertness: Awake/alert Behavior During Therapy: Restless Overall Cognitive Status: No family/caregiver present to determine baseline cognitive functioning                                 General Comments: answered no to most questions; followed a few commands, mostly to stand        Exercises     Shoulder Instructions       General Comments calm but restless    Pertinent Vitals/ Pain  Pain Assessment: Faces Pain Score: 0-No pain Faces Pain Scale: No hurt Pain Intervention(s): Premedicated before session;Monitored during session  Home Living                                          Prior Functioning/Environment              Frequency  Min 2X/week        Progress Toward Goals  OT Goals(current goals can now be found in the care plan section)  Progress towards OT goals: Not progressing toward goals - comment     Plan      Co-evaluation    PT/OT/SLP Co-Evaluation/Treatment: Yes Reason for Co-Treatment: For patient/therapist safety PT  goals addressed during session: Mobility/safety with mobility OT goals addressed during session: ADL's and self-care      AM-PAC PT "6 Clicks" Daily Activity     Outcome Measure   Help from another person eating meals?: Total Help from another person taking care of personal grooming?: A Lot Help from another person toileting, which includes using toliet, bedpan, or urinal?: Total Help from another person bathing (including washing, rinsing, drying)?: Total Help from another person to put on and taking off regular upper body clothing?: Total Help from another person to put on and taking off regular lower body clothing?: Total 6 Click Score: 7    End of Session    OT Visit Diagnosis: Unsteadiness on feet (R26.81)   Activity Tolerance Patient tolerated treatment well   Patient Left in bed;with call bell/phone within reach;with bed alarm set   Nurse Communication          Time: 2951-8841 OT Time Calculation (min): 35 min  Charges: OT General Charges $OT Visit: 1 Visit OT Treatments $Self Care/Home Management : 8-22 mins  Lesle Chris, OTR/L Acute Rehabilitation Services [phone] [facility] pager [phone] office 06/10/2018   Jermery Caratachea 06/10/2018, 4:42 PM

## 2018-06-11 MED ORDER — SENNOSIDES-DOCUSATE SODIUM 8.6-50 MG PO TABS
1.0000 | ORAL_TABLET | Freq: Every day | ORAL | Status: DC
  Administered 2018-06-11 – 2018-06-17 (×7): 1 via ORAL
  Filled 2018-06-11 (×7): qty 1

## 2018-06-11 MED ORDER — CLONAZEPAM 0.5 MG PO TABS: 0.2500 mg | ORAL_TABLET | Freq: Every day | ORAL | Status: DC

## 2018-06-11 MED ORDER — CLONAZEPAM 0.5 MG PO TABS
0.2500 mg | ORAL_TABLET | Freq: Every day | ORAL | Status: DC
  Administered 2018-06-12 – 2018-06-18 (×6): 0.25 mg via ORAL
  Filled 2018-06-11 (×2): qty 1
  Filled 2018-06-11 (×3): qty 0.5
  Filled 2018-06-11 (×2): qty 1

## 2018-06-11 NOTE — Progress Notes (Signed)
PROGRESS NOTE  Gina Edwards HQI:[id number] DOB: Jun 30, 1941 DOA: 05/12/2018 PCP: Shon Baton, MD  HPI/Recap of past 24 hours:   Has not had agitation in the last two days, but become more lethargic, appear sedated She does not eat much She does attempt to follow commands at times today, she could not tell me when is her birthday  No fever, labs unremarkable, urine is clear, on bm documented last two days  Assessment/Plan: Principal Problem:   Dementia with behavioral disturbance ([place]) Active Problems:   Aggressive behavior   Acute lower UTI   Sinusitis   UTI (urinary tract infection)   Pansinusitis   Evaluation by psychiatric service required  Dementia with behavioral disturbance -Psychiatry recommendations: no longer requiring inpatient behavioral health admission -Continue Zyprexa, depakote, lexapro, -case discussed with psych due to intermittent agitation, psych Dr Mariea Clonts recommended d/c prn ativan, increase night time depakote Per psychiatry Dr Mariea Clonts on 10/31 "-Continue Zyprexa 2.5 mg q am and 5 mg qhs for agitation and poor appetite.  -Consider Zyprexa 2.5 mg daily PRN for worsening agitation.  -ContinueDepakote 250 mg q am and 500 mg qhs for mood stabilization/agitation (increased on 10/30). -Continue Lexapro 10 mg daily for mood.  -Continue Klonopin 0.5 mg daily for agitation. Recommend judicious use of benzodiazepines as well as other sedating medications since risk to worsen mental status. -EKG reviewed and QTc462 on 10/5. Please closely monitor when starting or increasing QTc prolonging agents." -QTC 436 ib 11/1 She is more lethargic, will decrease klonopin dose from 0.5mg  daily to 0.25mg  daily  Klebsiella UTI Treated with ceftriaxone/Keflex. Completed course.  Poor oral intake In setting of acute psychosis. Improved slightly. -Encourage oral intake -IV fluids -Daily weight  Decreased urine output Resolved.  Pansinusitis Concern for chronic  sinusitis vs fungal ball. Recommendation for ENT follow-up  PPD placement Placed on 06/02/18   DVT prophylaxis: Lovenox Code Status:   Code Status: DNR Family Communication: patient   Disposition Plan: difficult placement, snf declined her initially due to agitation, snf declined her now because she appear too lethargic   Consultants:  psych  Procedures:  none  Antibiotics:  Keflex (10/2>>10/4; 10/7>>10/11)  Ceftriaxone (10/5>>10/7)    Objective: BP (!) 142/89 (BP Location: Left Arm)   Pulse 84   Temp 98.1 F (36.7 C) (Oral)   Resp 16   Wt 56.3 kg   SpO2 98%   BMI 20.03 kg/m   Intake/Output Summary (Last 24 hours) at 06/11/2018 1255 Last data filed at 06/11/2018 0854 Gross per 24 hour  Intake 940.76 ml  Output 50 ml  Net 890.76 ml   Filed Weights   06/07/18 0440 06/08/18 0617 06/11/18 0457  Weight: 58.3 kg 58.6 kg 56.3 kg    Exam: Patient is examined daily including today on 06/11/2018, exams remain the same as of yesterday except that has changed    General:  Open eyes to voice, does attempt to follow commands, speak in indistinct words, no agitation  Cardiovascular: RRR  Respiratory: CTABL  Abdomen: Soft/ND/NT, positive BS  Musculoskeletal: No Edema  Neuro: open eyes to voice  Data Reviewed: Basic Metabolic Panel: Recent Labs  Lab 06/07/18 0520 06/10/18 0349  NA 143 142  K 4.5 4.7  CL 106 104  CO2 29 27  GLUCOSE 97 85  BUN 13 14  CREATININE 0.70 0.60  CALCIUM 8.5* 8.9  MG  --  2.0   Liver Function Tests: Recent Labs  Lab 06/10/18 0349  AST 25  ALT 19  ALKPHOS 95  BILITOT 1.1  PROT 6.6  ALBUMIN 3.3*   No results for input(s): LIPASE, AMYLASE in the last 168 hours. No results for input(s): AMMONIA in the last 168 hours. CBC: Recent Labs  Lab 06/07/18 0520 06/10/18 0349  WBC 5.0 6.8  NEUTROABS 1.7 3.2  HGB 12.8 14.5  HCT 42.1 47.1*  MCV 97.5 97.3  PLT 220 218   Cardiac Enzymes:   No results for input(s):  CKTOTAL, CKMB, CKMBINDEX, TROPONINI in the last 168 hours. BNP (last 3 results) No results for input(s): BNP in the last 8760 hours.  ProBNP (last 3 results) No results for input(s): PROBNP in the last 8760 hours.  CBG: No results for input(s): GLUCAP in the last 168 hours.  No results found for this or any previous visit (from the past 240 hour(s)).   Studies: No results found.  Scheduled Meds: . cholecalciferol  2,000 Units Oral Daily  . [START ON 06/12/2018] clonazePAM  0.25 mg Oral Daily  . divalproex  250 mg Oral Daily  . divalproex  500 mg Oral QHS  . enoxaparin (LOVENOX) injection  40 mg Subcutaneous Daily  . escitalopram  10 mg Oral Daily  . feeding supplement (ENSURE ENLIVE)  237 mL Oral BID BM  . mouth rinse  15 mL Mouth Rinse BID  . OLANZapine  2.5 mg Oral Daily   And  . OLANZapine  5 mg Oral QHS    Continuous Infusions: . dextrose 5 % and 0.45 % NaCl with KCl 10 mEq/L 50 mL/hr at 06/09/18 1504     Time spent: 16mins, I have personally reviewed and interpreted on  06/11/2018 daily labs, imagings as discussed above under date review session and assessment and plans.  I reviewed all nursing notes, pharmacy notes, consultant notes,  vitals, pertinent old records  I have discussed plan of care as described above with RN , patient  on 06/11/2018   Florencia Reasons MD, PhD  [facility] Pager [phone]. If 7PM-7AM, please contact night-coverage at [email or web address], password [facility] 06/11/2018, 12:55 PM  LOS: 24 days

## 2018-06-12 NOTE — Progress Notes (Signed)
PROGRESS NOTE  Gina Edwards QQV:[id number] DOB: 11/25/1940 DOA: 05/12/2018 PCP: Shon Baton, MD  HPI/Recap of past 24 hours:   Per RN patient has agitation last night, she was trying to get out of bed and kicking She received haldol this am, now she is sedated  She does not eat much,   No fever, labs unremarkable, urine is clear, Last bm documented on 10/31,  on bm documented since  Assessment/Plan: Principal Problem:   Dementia with behavioral disturbance ([place]) Active Problems:   Aggressive behavior   Acute lower UTI   Sinusitis   UTI (urinary tract infection)   Pansinusitis   Evaluation by psychiatric service required  Dementia with behavioral disturbance -Psychiatry recommendations: no longer requiring inpatient behavioral health admission -Continue Zyprexa, depakote, lexapro, -case discussed with psych due to intermittent agitation, psych Dr Mariea Clonts recommended d/c prn ativan, increase night time depakote Per psychiatry Dr Mariea Clonts on 10/31 "-Continue Zyprexa 2.5 mg q am and 5 mg qhs for agitation and poor appetite.  -Consider Zyprexa 2.5 mg daily PRN for worsening agitation.  -ContinueDepakote 250 mg q am and 500 mg qhs for mood stabilization/agitation (increased on 10/30). -Continue Lexapro 10 mg daily for mood.  -Continue Klonopin 0.5 mg daily for agitation. Recommend judicious use of benzodiazepines as well as other sedating medications since risk to worsen mental status. -EKG reviewed and QTc462 on 10/5. Please closely monitor when starting or increasing QTc prolonging agents." -QTC 436 ib 11/1 - klonopin dose decreased from 0.5mg  daily to 0.25mg  daily on 11/1 due to patient appear to be sedated  Klebsiella UTI Treated with ceftriaxone/Keflex. Completed course.  Poor oral intake In setting of acute psychosis. Improved slightly. -Encourage oral intake -IV fluids stopped on 11/1, may need to restart if still not eat adeqately -Daily weight  Decreased  urine output Resolved.  Pansinusitis Concern for chronic sinusitis vs fungal ball. Recommendation for ENT follow-up  PPD placement Placed on 06/02/18   DVT prophylaxis: Lovenox Code Status:   Code Status: DNR Family Communication: patient   Disposition Plan: difficult placement, snf declined her initially due to agitation, snf declined her now because she appear too lethargic   Consultants:  psych  Procedures:  none  Antibiotics:  Keflex (10/2>>10/4; 10/7>>10/11)  Ceftriaxone (10/5>>10/7)    Objective: BP 136/72 (BP Location: Right Arm)   Pulse 96   Temp 97.9 F (36.6 C) (Axillary)   Resp 14   Wt 55.2 kg   SpO2 98%   BMI 19.64 kg/m   Intake/Output Summary (Last 24 hours) at 06/12/2018 1157 Last data filed at 06/12/2018 0600 Gross per 24 hour  Intake 679.58 ml  Output 150 ml  Net 529.58 ml   Filed Weights   06/08/18 0617 06/11/18 0457 06/12/18 0420  Weight: 58.6 kg 56.3 kg 55.2 kg    Exam: Patient is examined daily including today on 06/12/2018, exams remain the same as of yesterday except that has changed    General:  Open eyes to voice, she is able to speak a few words clearly this am, then become drowsy and speaks in indistinct words again, no agitation  Cardiovascular: RRR  Respiratory: CTABL  Abdomen: Soft/ND/NT, positive BS  Musculoskeletal: No Edema  Neuro: open eyes to voice  Data Reviewed: Basic Metabolic Panel: Recent Labs  Lab 06/07/18 0520 06/10/18 0349  NA 143 142  K 4.5 4.7  CL 106 104  CO2 29 27  GLUCOSE 97 85  BUN 13 14  CREATININE 0.70 0.60  CALCIUM  8.5* 8.9  MG  --  2.0   Liver Function Tests: Recent Labs  Lab 06/10/18 0349  AST 25  ALT 19  ALKPHOS 95  BILITOT 1.1  PROT 6.6  ALBUMIN 3.3*   No results for input(s): LIPASE, AMYLASE in the last 168 hours. No results for input(s): AMMONIA in the last 168 hours. CBC: Recent Labs  Lab 06/07/18 0520 06/10/18 0349  WBC 5.0 6.8  NEUTROABS 1.7 3.2  HGB  12.8 14.5  HCT 42.1 47.1*  MCV 97.5 97.3  PLT 220 218   Cardiac Enzymes:   No results for input(s): CKTOTAL, CKMB, CKMBINDEX, TROPONINI in the last 168 hours. BNP (last 3 results) No results for input(s): BNP in the last 8760 hours.  ProBNP (last 3 results) No results for input(s): PROBNP in the last 8760 hours.  CBG: No results for input(s): GLUCAP in the last 168 hours.  No results found for this or any previous visit (from the past 240 hour(s)).   Studies: No results found.  Scheduled Meds: . cholecalciferol  2,000 Units Oral Daily  . clonazePAM  0.25 mg Oral Daily  . divalproex  250 mg Oral Daily  . divalproex  500 mg Oral QHS  . enoxaparin (LOVENOX) injection  40 mg Subcutaneous Daily  . escitalopram  10 mg Oral Daily  . feeding supplement (ENSURE ENLIVE)  237 mL Oral BID BM  . mouth rinse  15 mL Mouth Rinse BID  . OLANZapine  2.5 mg Oral Daily   And  . OLANZapine  5 mg Oral QHS  . senna-docusate  1 tablet Oral QHS    Continuous Infusions: . dextrose 5 % and 0.45 % NaCl with KCl 10 mEq/L Stopped (06/11/18 1723)     Time spent: 68mins, I have personally reviewed and interpreted on  06/12/2018 daily labs, imagings as discussed above under date review session and assessment and plans.  I reviewed all nursing notes, pharmacy notes, consultant notes,  vitals, pertinent old records  I have discussed plan of care as described above with RN , patient  on 06/12/2018   Florencia Reasons MD, PhD  [facility] Pager [phone]. If 7PM-7AM, please contact night-coverage at [email or web address], password [email or web address] 06/12/2018, 11:57 AM  LOS: 25 days

## 2018-06-13 MED ORDER — HALOPERIDOL LACTATE 5 MG/ML IJ SOLN
5.0000 mg | Freq: Once | INTRAMUSCULAR | Status: DC
  Filled 2018-06-13 (×2): qty 1

## 2018-06-13 NOTE — Progress Notes (Addendum)
PROGRESS NOTE  Gina Edwards MGQ:[id number] DOB: [birth year] DOA: 05/12/2018 PCP: Shon Baton, MD  HPI/Recap of past 24 hours:   One does of prn haldol ordered last night at 1am,  There is no sitter, she did not require mittens,  She is alert and calm this am, though appear to talk more but incoherent, does not follow commands   She does not eat much, have dry oral mucosa, urine output 1.2literdocumented last 24hrs,having bm.  No fever, urine is clear,   Assessment/Plan: Principal Problem:   Dementia with behavioral disturbance ([facility]) Active Problems:   Aggressive behavior   Acute lower UTI   Sinusitis   UTI (urinary tract infection)   Pansinusitis   Evaluation by psychiatric service required  Dementia with behavioral disturbance -Psychiatry recommendations: no longer requiring inpatient behavioral health admission -Continue Zyprexa, depakote, lexapro, -case discussed with psych due to intermittent agitation, psych Dr Mariea Clonts recommended d/c prn ativan, increase night time depakote Per psychiatry Dr Mariea Clonts on 10/31 "-Continue Zyprexa 2.5 mg q am and 5 mg qhs for agitation and poor appetite.  -Consider Zyprexa 2.5 mg daily PRN for worsening agitation.  -ContinueDepakote 250 mg q am and 500 mg qhs for mood stabilization/agitation (increased on 10/30). -Continue Lexapro 10 mg daily for mood.  -Continue Klonopin 0.5 mg daily for agitation. Recommend judicious use of benzodiazepines as well as other sedating medications since risk to worsen mental status. -EKG reviewed and QTc462 on 10/5. Please closely monitor when starting or increasing QTc prolonging agents." -QTC 436 is 11/1 - klonopin dose decreased from 0.5mg  daily to 0.25mg  daily on 11/1 due to patient appear to be sedated  Klebsiella UTI Treated with ceftriaxone/Keflex. Completed course.  Poor oral intake In setting of acute psychosis. Improved slightly. -Encourage oral intake -IV fluids stopped on 11/1, may  need to restart if still not eat adeqately -Daily weight Will start calorie count  Decreased urine output Resolved.  Pansinusitis Concern for chronic sinusitis vs fungal ball. Recommendation for ENT follow-up  PPD placement Placed on 06/02/18   DVT prophylaxis: Lovenox Code Status:   Code Status: DNR Family Communication: patient   Disposition Plan: difficult placement, snf declined her initially due to agitation, snf declined her now because she appear too lethargic   Consultants:  psych  Procedures:  none  Antibiotics:  Keflex (10/2>>10/4; 10/7>>10/11)  Ceftriaxone (10/5>>10/7)    Objective: BP (!) 148/66 (BP Location: Left Arm) Comment: RN aware  Pulse 79   Temp 98 F (36.7 C) (Oral)   Resp 16   Wt 55.2 kg   SpO2 99%   BMI 19.64 kg/m   Intake/Output Summary (Last 24 hours) at 06/13/2018 1224 Last data filed at 06/13/2018 0434 Gross per 24 hour  Intake -  Output 1550 ml  Net -1550 ml   Filed Weights   06/11/18 0457 06/12/18 0420 06/13/18 0434  Weight: 56.3 kg 55.2 kg 55.2 kg    Exam: Patient is examined daily including today on 06/13/2018, exams remain the same as of yesterday except that has changed    General:  Open eyes to voice, she is able to speak a few words clearly this am, incoherent, no agitation, does not follow command, dry oral mucosa  Cardiovascular: RRR  Respiratory: CTABL  Abdomen: Soft/ND/NT, positive BS  Musculoskeletal: No Edema  Neuro: open eyes to voice  Data Reviewed: Basic Metabolic Panel: Recent Labs  Lab 06/07/18 0520 06/10/18 0349  NA 143 142  K 4.5 4.7  CL 106 104  CO2 29 27  GLUCOSE 97 85  BUN 13 14  CREATININE 0.70 0.60  CALCIUM 8.5* 8.9  MG  --  2.0   Liver Function Tests: Recent Labs  Lab 06/10/18 0349  AST 25  ALT 19  ALKPHOS 95  BILITOT 1.1  PROT 6.6  ALBUMIN 3.3*   No results for input(s): LIPASE, AMYLASE in the last 168 hours. No results for input(s): AMMONIA in the last 168  hours. CBC: Recent Labs  Lab 06/07/18 0520 06/10/18 0349  WBC 5.0 6.8  NEUTROABS 1.7 3.2  HGB 12.8 14.5  HCT 42.1 47.1*  MCV 97.5 97.3  PLT 220 218   Cardiac Enzymes:   No results for input(s): CKTOTAL, CKMB, CKMBINDEX, TROPONINI in the last 168 hours. BNP (last 3 results) No results for input(s): BNP in the last 8760 hours.  ProBNP (last 3 results) No results for input(s): PROBNP in the last 8760 hours.  CBG: No results for input(s): GLUCAP in the last 168 hours.  No results found for this or any previous visit (from the past 240 hour(s)).   Studies: No results found.  Scheduled Meds: . cholecalciferol  2,000 Units Oral Daily  . clonazePAM  0.25 mg Oral Daily  . divalproex  250 mg Oral Daily  . divalproex  500 mg Oral QHS  . enoxaparin (LOVENOX) injection  40 mg Subcutaneous Daily  . escitalopram  10 mg Oral Daily  . feeding supplement (ENSURE ENLIVE)  237 mL Oral BID BM  . mouth rinse  15 mL Mouth Rinse BID  . OLANZapine  2.5 mg Oral Daily   And  . OLANZapine  5 mg Oral QHS  . senna-docusate  1 tablet Oral QHS    Continuous Infusions: . dextrose 5 % and 0.45 % NaCl with KCl 10 mEq/L Stopped (06/11/18 1723)     Time spent: 21mins, I have personally reviewed and interpreted on  06/13/2018 daily labs, imagings as discussed above under date review session and assessment and plans.  I reviewed all nursing notes, pharmacy notes, consultant notes,  vitals, pertinent old records  I have discussed plan of care as described above with RN , patient  on 06/13/2018   Florencia Reasons MD, PhD  [facility] Pager [phone]. If 7PM-7AM, please contact night-coverage at [email or web address], password [facility] 06/13/2018, 12:24 PM  LOS: 26 days

## 2018-06-13 NOTE — Progress Notes (Signed)
Patient becoming increasingly agitated. Refusing to take anything by mouth today. At this time, climbing out of bed, swinging and kicking at staff. IV haldol given per order.

## 2018-06-14 LAB — MAGNESIUM: Magnesium: 1.9 mg/dL (ref 1.7–2.4)

## 2018-06-14 MED ORDER — HALOPERIDOL LACTATE 5 MG/ML IJ SOLN: 1.0000 mg | Freq: Four times a day (QID) | INTRAMUSCULAR | Status: DC | PRN

## 2018-06-14 NOTE — Care Management Important Message (Signed)
Important Message  Patient Details  Name: Gina Edwards MRN: [id number] Date of Birth: 1940/12/15   Medicare Important Message Given:  Yes    Kerin Salen 06/14/2018, 11:20 [place] Message  Patient Details  Name: Gina Edwards MRN: [id number] Date of Birth: Sep 09, 1940   Medicare Important Message Given:  Yes    Kerin Salen 06/14/2018, 11:20 AM

## 2018-06-14 NOTE — Progress Notes (Signed)
Nutrition Follow-up  INTERVENTION:   Continue Ensure Enlive po BID, each supplement provides 350 kcal and 20 grams of protein Staff will continue to encourage PO intakes  NUTRITION DIAGNOSIS:   Inadequate oral intake related to (AMS, dementia) as evidenced by meal completion < 50%.  Ongoing.  GOAL:   Patient will meet greater than or equal to 90% of their needs  Not meeting.  MONITOR:   PO intake, Supplement acceptance, Labs, Weight trends, I & O's  REASON FOR ASSESSMENT:   Consult Calorie Count  ASSESSMENT:   77 y.o. female with a history of dementia, anxiety, and depression who presented from home with agitation and progressively worsening aggressive behavior  Patient awake in room. Per staff, pt is refusing most everything PO including medications. Did not consume more than a spoonful of pudding in an attempt to give medication.  PO intake for 11/3: 0%  No envelope on door for Calorie Count as pt is not consuming enough to justify it being written down. RD to check with staff to see if there is any improvements in intake which is dependent on pt's mental status and need for sedatives. Pt was agitated and combative yesterday, received Haldol doses.   Medications: Vitamin D tablet daily, Senokot-S tablet daily Labs reviewed: Mg/Phos WNL  Diet Order:   Diet Order            Diet Heart Room service appropriate? Yes; Fluid consistency: Thin  Diet effective now              EDUCATION NEEDS:   Not appropriate for education at this time  Skin:  Skin Assessment: Reviewed RN Assessment  Last BM:  10/31  Height:   Ht Readings from Last 1 Encounters:  02/06/18 5\' 6"  (1.676 m)    Weight:   Wt Readings from Last 1 Encounters:  06/14/18 53.4 kg    Ideal Body Weight:  59.1 kg  BMI:  Body mass index is 19 kg/m.  Estimated Nutritional Needs:   Kcal:  4356-8616  Protein:  70-80g  Fluid:  1.6L/day  Clayton Bibles, MS, RD, LDN [place] [facility] Pager: [phone] After Hours Pager: [phone]

## 2018-06-14 NOTE — Progress Notes (Signed)
PROGRESS NOTE  Gina Edwards NLG:[id number] DOB: 20-Jul-1941 DOA: 05/12/2018 PCP: Shon Baton, MD  HPI/Recap of past 24 hours:  She did not require any prn agitation meds last night  There is no sitter, she did not require mittens,   She is alert and calm this am, appear comfortable and  follow commands, talks more, appear improving   She does not eat much, have dry oral mucosa,   No fever, urine is clear,   Assessment/Plan: Principal Problem:   Dementia with behavioral disturbance ([place]) Active Problems:   Aggressive behavior   Acute lower UTI   Sinusitis   UTI (urinary tract infection)   Pansinusitis   Evaluation by psychiatric service required  Dementia with behavioral disturbance -per chart review, patient has been diagnosed with dementia in 2009, she has been followed by neurology in the past -case discussed with psych due to intermittent agitation, psych Dr Mariea Clonts recommended d/c prn ativan, increase night time depakote Per psychiatry Dr Mariea Clonts on 10/31 "-Continue Zyprexa 2.5 mg q am and 5 mg qhs for agitation and poor appetite.  -Consider Zyprexa 2.5 mg daily PRN for worsening agitation.  -ContinueDepakote 250 mg q am and 500 mg qhs for mood stabilization/agitation (increased on 10/30). -Continue Lexapro 10 mg daily for mood.  -Continue Klonopin 0.5 mg daily for agitation. Recommend judicious use of benzodiazepines as well as other sedating medications since risk to worsen mental status. -EKG reviewed and QTc462 on 10/5. Please closely monitor when starting or increasing QTc prolonging agents." -QTC 436 is 11/1 - klonopin dose decreased from 0.5mg  daily to 0.25mg  daily on 11/1 due to patient appear to be sedated -son wants patient to d/c to snf for now and he hope patient can improve to the point that eventually she can be placed at a memory care unit -so far snf has not accepted her due to either too agitated or too sedated  Klebsiella UTI Treated with  ceftriaxone/Keflex. Completed course.  Poor oral intake In setting of acute psychosis. Improved slightly. -Encourage oral intake -IV fluids stopped on 11/1, may need to restart if still not eat adeqately -Daily weight Will start calorie count   Pansinusitis ct "Pansinusitis with near complete paranasal sinus opacification. High density in the right maxillary sinus can be seen with fungal sinusitis or inspissated secretions." She does not appear to have headache, no nasal drainage, no fever, no leukocytosis, no hypoxia Recommend ENT outpatient follow up  PPD placement Placed on 06/02/18   DVT prophylaxis: Lovenox Code Status:   Code Status: DNR Family Communication: patient   Disposition Plan: difficult placement, snf declined her initially due to agitation, snf declined her now because she appear too lethargic   Consultants:  psych  Procedures:  none  Antibiotics:  Keflex (10/2>>10/4; 10/7>>10/11)  Ceftriaxone (10/5>>10/7)    Objective: BP (!) 158/65 (BP Location: Right Arm)   Pulse 72   Temp 98.4 F (36.9 C) (Axillary)   Resp 16   Wt 53.4 kg   SpO2 98%   BMI 19.00 kg/m   Intake/Output Summary (Last 24 hours) at 06/14/2018 1310 Last data filed at 06/13/2018 1700 Gross per 24 hour  Intake 0 ml  Output 300 ml  Net -300 ml   Filed Weights   06/12/18 0420 06/13/18 0434 06/14/18 0455  Weight: 55.2 kg 55.2 kg 53.4 kg    Exam: Patient is examined daily including today on 06/14/2018, exams remain the same as of yesterday except that has changed    General:  Open eyes to voice, she is able to speak a few words clearly this am, incoherent, no agitation, does not follow command, dry oral mucosa  Cardiovascular: RRR  Respiratory: CTABL  Abdomen: Soft/ND/NT, positive BS  Musculoskeletal: No Edema  Neuro: open eyes to voice  Data Reviewed: Basic Metabolic Panel: Recent Labs  Lab 06/10/18 0349 06/14/18 0510  NA 142  --   K 4.7  --   CL 104   --   CO2 27  --   GLUCOSE 85  --   BUN 14  --   CREATININE 0.60  --   CALCIUM 8.9  --   MG 2.0 1.9   Liver Function Tests: Recent Labs  Lab 06/10/18 0349  AST 25  ALT 19  ALKPHOS 95  BILITOT 1.1  PROT 6.6  ALBUMIN 3.3*   No results for input(s): LIPASE, AMYLASE in the last 168 hours. No results for input(s): AMMONIA in the last 168 hours. CBC: Recent Labs  Lab 06/10/18 0349  WBC 6.8  NEUTROABS 3.2  HGB 14.5  HCT 47.1*  MCV 97.3  PLT 218   Cardiac Enzymes:   No results for input(s): CKTOTAL, CKMB, CKMBINDEX, TROPONINI in the last 168 hours. BNP (last 3 results) No results for input(s): BNP in the last 8760 hours.  ProBNP (last 3 results) No results for input(s): PROBNP in the last 8760 hours.  CBG: No results for input(s): GLUCAP in the last 168 hours.  No results found for this or any previous visit (from the past 240 hour(s)).   Studies: No results found.  Scheduled Meds: . cholecalciferol  2,000 Units Oral Daily  . clonazePAM  0.25 mg Oral Daily  . divalproex  250 mg Oral Daily  . divalproex  500 mg Oral QHS  . enoxaparin (LOVENOX) injection  40 mg Subcutaneous Daily  . escitalopram  10 mg Oral Daily  . feeding supplement (ENSURE ENLIVE)  237 mL Oral BID BM  . haloperidol lactate  5 mg Intravenous Once  . mouth rinse  15 mL Mouth Rinse BID  . OLANZapine  2.5 mg Oral Daily   And  . OLANZapine  5 mg Oral QHS  . senna-docusate  1 tablet Oral QHS    Continuous Infusions: . dextrose 5 % and 0.45 % NaCl with KCl 10 mEq/L Stopped (06/11/18 1723)     Time spent: 26mins, I have personally reviewed and interpreted on  06/14/2018 daily labs, imagings as discussed above under date review session and assessment and plans.  I reviewed all nursing notes, pharmacy notes, consultant notes,  vitals, pertinent old records  I have discussed plan of care as described above with RN , patient  on 06/14/2018   Florencia Reasons MD, PhD  [facility] Pager [phone].  If 7PM-7AM, please contact night-coverage at [email or web address], password [facility] 06/14/2018, 1:10 PM  LOS: 27 days

## 2018-06-15 LAB — MAGNESIUM: Magnesium: 2 mg/dL (ref 1.7–2.4)

## 2018-06-15 LAB — CBC WITH DIFFERENTIAL/PLATELET
ABS IMMATURE GRANULOCYTES: 0.07 10*3/uL (ref 0.00–0.07)
Basophils Absolute: 0.1 10*3/uL (ref 0.0–0.1)
Basophils Relative: 1 %
Eosinophils Absolute: 0.1 10*3/uL (ref 0.0–0.5)
Eosinophils Relative: 1 %
HEMATOCRIT: 47.8 % — AB (ref 36.0–46.0)
Hemoglobin: 14.9 g/dL (ref 12.0–15.0)
IMMATURE GRANULOCYTES: 1 %
LYMPHS ABS: 2.2 10*3/uL (ref 0.7–4.0)
Lymphocytes Relative: 40 %
MCH: 30 pg (ref 26.0–34.0)
MCHC: 31.2 g/dL (ref 30.0–36.0)
MCV: 96.2 fL (ref 80.0–100.0)
MONOS PCT: 15 %
Monocytes Absolute: 0.8 10*3/uL (ref 0.1–1.0)
NEUTROS ABS: 2.3 10*3/uL (ref 1.7–7.7)
NEUTROS PCT: 42 %
Platelets: 205 10*3/uL (ref 150–400)
RBC: 4.97 MIL/uL (ref 3.87–5.11)
RDW: 14.3 % (ref 11.5–15.5)
WBC: 5.4 10*3/uL (ref 4.0–10.5)
nRBC: 0 % (ref 0.0–0.2)

## 2018-06-15 LAB — BASIC METABOLIC PANEL
ANION GAP: 12 (ref 5–15)
BUN: 25 mg/dL — ABNORMAL HIGH (ref 8–23)
CHLORIDE: 106 mmol/L (ref 98–111)
CO2: 29 mmol/L (ref 22–32)
Calcium: 9.3 mg/dL (ref 8.9–10.3)
Creatinine, Ser: 0.56 mg/dL (ref 0.44–1.00)
GFR calc Af Amer: >60 mL/min (ref >60)
GFR calc non Af Amer: >60 mL/min (ref >60)
GLUCOSE: 93 mg/dL (ref 70–99)
POTASSIUM: 4.2 mmol/L (ref 3.5–5.1)
Sodium: 147 mmol/L — ABNORMAL HIGH (ref 135–145)

## 2018-06-15 MED ORDER — DEXTROSE-NACL 5-0.45 % IV SOLN
INTRAVENOUS | Status: AC
  Administered 2018-06-15 – 2018-06-17 (×3): via INTRAVENOUS

## 2018-06-15 NOTE — Progress Notes (Signed)
Occupational Therapy Treatment Patient Details Name: Gina Edwards MRN: [id number] DOB: [birth year] Today's Date: 06/15/2018    History of present illness Pt was admitted for dementia with behavioral disturbance, Dx of UTI. PMH: anxiety and depression   OT comments  Sat eob x 20 min with min guard. Worked on self feeding and grooming. May benefit from built up foam on utensil.  Needs assist to scoop and find mouth  Follow Up Recommendations  SNF    Equipment Recommendations  3 in 1 bedside commode    Recommendations for Other Services      Precautions / Restrictions Precautions Precautions: Fall Precaution Comments: no aggressive behavior this session Restrictions Weight Bearing Restrictions: No       Mobility Bed Mobility         Supine to sit: Mod assist Sit to supine: Mod assist   General bed mobility comments: assist for trunk and to initiate legs for EOB and mostly for legs for back to bed  Transfers                      Balance     Sitting balance-Leahy Scale: Fair Sitting balance - Comments: min guard for safety                                   ADL either performed or assessed with clinical judgement   ADL   Eating/Feeding: Maximal assistance;Sitting Eating/Feeding Details (indicate cue type and reason): sat eob; assist to scoop, place utensil in hand and direct to mouth as pt often aimed to L.  Did better with bites when she was involved with activity.  Held cup with min guard.  Therapist controlled amount by occluding straw. Near end (about 1/6 of meal) pt blew bubbles   Grooming Details (indicate cue type and reason): set up for hands and to wipe mouth; total A for hair                               General ADL Comments: sat EOB with min guard (after sitting up)     Vision       Perception     Praxis      Cognition Arousal/Alertness: Awake/alert Behavior During Therapy: WFL for tasks  assessed/performed Overall Cognitive Status: No family/caregiver present to determine baseline cognitive functioning                                 General Comments: h/o dementia; follows some commands        Exercises     Shoulder Instructions       General Comments sat EOB x 20 minutes mostly with min guard    Pertinent Vitals/ Pain       Pain Assessment: Faces Faces Pain Scale: No hurt  Home Living                                          Prior Functioning/Environment              Frequency           Progress Toward Goals  OT Goals(current goals can now be found in the care plan  section)  Progress towards OT goals: Progressing toward goals  ADL Goals Pt Will Perform Eating: with mod assist;with adaptive utensils;sitting(x 5 bites and 5 sips with built up handle)  Plan      Co-evaluation                 AM-PAC PT "6 Clicks" Daily Activity     Outcome Measure   Help from another person eating meals?: Total Help from another person taking care of personal grooming?: A Lot Help from another person toileting, which includes using toliet, bedpan, or urinal?: Total Help from another person bathing (including washing, rinsing, drying)?: Total Help from another person to put on and taking off regular upper body clothing?: Total Help from another person to put on and taking off regular lower body clothing?: Total 6 Click Score: 7    End of Session    OT Visit Diagnosis: Unsteadiness on feet (R26.81)   Activity Tolerance Patient tolerated treatment well   Patient Left in bed;with call bell/phone within reach;with bed alarm set   Nurse Communication (pt coughing at end of tx; HOB raised)        Time: 1102-1117 OT Time Calculation (min): 32 min  Charges: OT General Charges $OT Visit: 1 Visit OT Treatments $Self Care/Home Management : 23-37 mins  Lesle Chris, OTR/L Acute Rehabilitation  Services [phone] [facility] pager [phone] office 06/15/2018   Srihaan Mastrangelo 06/15/2018, 11:28 AM

## 2018-06-15 NOTE — Progress Notes (Signed)
Nutrition Note  Patient continues with poor PO intakes. Will d/c calorie count at this time.   Labs and medications reviewed.   Will continue to monitor for improvements in PO intake. Will continue Ensure supplement order.  Clayton Bibles, MS, RD, [place] Dietitian Pager: [phone] After Hours Pager: [phone]

## 2018-06-15 NOTE — Progress Notes (Signed)
Discussed pt's disposition plans with son and daughter in law this morning. Family concerned about pt's diet and lack of oral intake. Also concerned that she remains very weak/deconditioned and discussed wondering whether at this point her deconditioning is more likely from prolonged hospital stay or from progressing dementia. Remain hopeful that pt could rehabilitate in facility post DC but also considering if palliative approach to her care would be more beneficial/reasonable. (Pt has been accepted to SNF- [facility], however her mental status and behaviors and poor oral intake have delayed being able to admit to SNF with reasonable expectation of good progress there.) Discussed with attending today and noted pt to receive ST and also follow up from PT/OT today. Palliative Medicine Team also consulted.  Will continue following.  Sharren Bridge, MSW, LCSW Clinical Social Work 06/15/2018 [phone]
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
## 2018-06-15 NOTE — Evaluation (Addendum)
Clinical/Bedside Swallow Evaluation Patient Details  Name: Gina Edwards MRN: [id number] Date of Birth: [birth year]  Today's Date: 06/15/2018 Time: SLP Start Time (ACUTE ONLY): 1238 SLP Stop Time (ACUTE ONLY): 1308 SLP Time Calculation (min) (ACUTE ONLY): 30 min  Past Medical History:  Past Medical History:  Diagnosis Date  . Anxiety 12/02/2012  . Anxiety and depression   . Dementia ([place]) 12/02/2012  . Depression 12/02/2012  . H/O meningioma of the brain 12/02/2012  . SUI (stress urinary incontinence, female)    Past Surgical History:  Past Surgical History:  Procedure Laterality Date  . APPENDECTOMY  1957  . TUBAL LIGATION Bilateral 1975   HPI:      Assessment / Plan / Recommendation Clinical Impression  Pt presents with suspected cognitive based oral dysphagia and possible pharyngeal difficulties.  She was willing to accept only a few boluses of icecream *(x5) and soda via tip of straw.  Delayed cough noted after swallow of icecream - cough was subtle and pt did not appear uncomfortable.  She did however decline further intake after minimal amount.  Suspect pt may have some low grade aspiration that she is tolerating at this time.    Family arrived - son *who is deplying in two days [place]* and his wife and they both report pt has presented with subtle cough with intake over the last 10-14 days.  Son/wife concerned pt was receiving regular foods that she could not manage since she has become increasingly weak during hospital coarse.  Pt has been in the hospital for a month and per daughter in law, she has progressively worsened.    Recommend for instituional feeding, puree/thin recommended.  Recommend family be allowed to give pt few bites of regular food when visiting as long as she is tolerating.  Provided family with information regarding comfort feeding - and reviewed that goals transition from nutrition to po for enjoyment/comfort.    Recommend continue dys1/thin diet with  precautions- monitoring vitals, etc= providing po as pt willing/able.  Per family, palliative referral pending. Will follow up ideally after palliative meeting.  Educated family to recommendations for precautions/compensations.   SLP Visit Diagnosis: Dysphagia, oropharyngeal phase (R13.12)    Aspiration Risk  Moderate aspiration risk;Risk for inadequate nutrition/hydration    Diet Recommendation Dysphagia 1 (Puree);Thin liquid   Liquid Administration via: Cup(tip of straw) Medication Administration: Whole meds with puree Supervision: Staff to assist with self feeding;Full supervision/cueing for compensatory strategies Compensations: Slow rate;Small sips/bites;Other (Comment)(assure pt swallows before giving more) Postural Changes: Seated upright at 90 degrees;Remain upright for at least 30 minutes after po intake    Other  Recommendations Oral Care Recommendations: Oral care BID   Follow up Recommendations        Frequency and Duration min 2x/week  1 week       Prognosis Prognosis for Safe Diet Advancement: Fair Barriers to Reach Goals: Other (Comment);Motivation;Cognitive deficits(lack of po intake)      Swallow Study   General      Oral/Motor/Sensory Function Overall Oral Motor/Sensory Function: Generalized oral weakness(pt did not follow directions, mild labial weakness, c/b decreased closure on straw, speech dysarthric)   Ice Chips Ice chips: Not tested   Thin Liquid Thin Liquid: Impaired Presentation: (tip of straw bolus provided) Oral Phase Impairments: Reduced labial seal;Reduced lingual movement/coordination Oral Phase Functional Implications: Prolonged oral transit;Oral holding Pharyngeal  Phase Impairments: Other (comments)(audible swallow) Other Comments: suspect oral holding, delay in oral transiting    Nectar Thick Nectar Thick Liquid:  Not tested   Honey Thick Honey Thick Liquid: Not tested   Puree Puree: Impaired(icecream, pt declined further  intake) Presentation: Spoon Oral Phase Functional Implications: Prolonged oral transit Pharyngeal Phase Impairments: Cough - Delayed Other Comments: delayed subtle cough with 30% of boluses    Solid     Solid: Not tested Other Comments: pt declined      Macario Golds 06/15/2018,1:23 PM   Luanna Salk, [facility] SLP [place] Pager [phone] Office [phone]

## 2018-06-16 DIAGNOSIS — N3000 Acute cystitis without hematuria: Secondary | ICD-10-CM

## 2018-06-16 DIAGNOSIS — Z7189 Other specified counseling: Secondary | ICD-10-CM

## 2018-06-16 DIAGNOSIS — F0281 Dementia in other diseases classified elsewhere with behavioral disturbance: Secondary | ICD-10-CM

## 2018-06-16 DIAGNOSIS — Z515 Encounter for palliative care: Secondary | ICD-10-CM

## 2018-06-16 DIAGNOSIS — G301 Alzheimer's disease with late onset: Secondary | ICD-10-CM

## 2018-06-16 LAB — CBC
HEMATOCRIT: 41.6 % (ref 36.0–46.0)
Hemoglobin: 13.5 g/dL (ref 12.0–15.0)
MCH: 30.4 pg (ref 26.0–34.0)
MCHC: 32.5 g/dL (ref 30.0–36.0)
MCV: 93.7 fL (ref 80.0–100.0)
NRBC: 0 % (ref 0.0–0.2)
Platelets: 127 10*3/uL — ABNORMAL LOW (ref 150–400)
RBC: 4.44 MIL/uL (ref 3.87–5.11)
RDW: 14.6 % (ref 11.5–15.5)
WBC: 5.7 10*3/uL (ref 4.0–10.5)

## 2018-06-16 LAB — BASIC METABOLIC PANEL
ANION GAP: 9 (ref 5–15)
BUN: 28 mg/dL — ABNORMAL HIGH (ref 8–23)
CHLORIDE: 108 mmol/L (ref 98–111)
CO2: 27 mmol/L (ref 22–32)
Calcium: 8.7 mg/dL — ABNORMAL LOW (ref 8.9–10.3)
Creatinine, Ser: 0.69 mg/dL (ref 0.44–1.00)
GFR calc non Af Amer: >60 mL/min (ref >60)
Glucose, Bld: 120 mg/dL — ABNORMAL HIGH (ref 70–99)
Potassium: 4.1 mmol/L (ref 3.5–5.1)
Sodium: 144 mmol/L (ref 135–145)

## 2018-06-16 NOTE — Progress Notes (Signed)
Palliative Care Brief Note  Palliative consult received.  Discussed with pt son.    Plan on discharge will be transition to long term care with hospice support/ plan for no further rehospitalization.  Appreciate SW assistance.  Full consult to follow.  Micheline Rough, MD [place] Team [phone]

## 2018-06-16 NOTE — Progress Notes (Signed)
Physical Therapy Treatment Patient Details Name: Gina Edwards MRN: [id number] DOB: 1940/10/05 Today's Date: 06/16/2018    History of Present Illness Pt was admitted for dementia with behavioral disturbance, Dx of UTI. PMH: anxiety and depression    PT Comments    The patient's ability to participate and mobilize is  Inconsistent. Patient is not making functional gains to return to supervision level. Note that Palliative consult is pending. Continue PT  When indicated.    Follow Up Recommendations  Supervision for mobility/OOB(patient is inconsistent with ability to participate)     Equipment Recommendations       Recommendations for Other Services       Precautions / Restrictions Precautions Precautions: Fall Precaution Comments: no aggressive behavior this session Restrictions Weight Bearing Restrictions: No    Mobility  Bed Mobility   Bed Mobility: Supine to Sit;Sit to Supine Rolling: Total assist   Supine to sit: Total assist Sit to supine: Total assist   General bed mobility comments: patient is rigid in bed, legs are flexed and UE's flexed over chest. Assisted to sitting up  with patient's posture not changing.  Transfers Overall transfer level: Needs assistance Equipment used: Rolling walker (2 wheeled) Transfers: Sit to/from Stand Sit to Stand: Max assist;+2 safety/equipment Stand pivot transfers: Max assist;+2 safety/equipment       General transfer comment: Assisted to rise from bed, trunk flexed, does bear weight and gradually sits back down.  Ambulation/Gait                 Stairs             Wheelchair Mobility    Modified Rankin (Stroke Patients Only)       Balance   Sitting-balance support: Feet supported Sitting balance-Leahy Scale: Fair Sitting balance - Comments: min guard for safety   Standing balance support: Bilateral upper extremity supported;During functional activity Standing balance-Leahy Scale: Zero                               Cognition Arousal/Alertness: Lethargic Behavior During Therapy: WFL for tasks assessed/performed                                   General Comments: patient essentially catatonic,  did not really follow commands,      Exercises      General Comments        Pertinent Vitals/Pain Faces Pain Scale: No hurt    Home Living                      Prior Function            PT Goals (current goals can now be found in the care plan section) Acute Rehab PT Goals PT Goal Formulation: Patient unable to participate in goal setting Time For Goal Achievement: 06/30/18 Potential to Achieve Goals: Poor Progress towards PT goals: Not progressing toward goals - comment(goals updated)    Frequency    Min 1X/week      PT Plan Current plan remains appropriate    Co-evaluation              AM-PAC PT "6 Clicks" Daily Activity  Outcome Measure  Difficulty turning over in bed (including adjusting bedclothes, sheets and blankets)?: Unable Difficulty moving from lying on back to sitting on the side of the bed? :  Unable Difficulty sitting down on and standing up from a chair with arms (e.g., wheelchair, bedside commode, etc,.)?: Unable Help needed moving to and from a bed to chair (including a wheelchair)?: Total Help needed walking in hospital room?: Total Help needed climbing 3-5 steps with a railing? : Total 6 Click Score: 6    End of Session   Activity Tolerance: Patient tolerated treatment well Patient left: in bed;with call bell/phone within reach;with bed alarm set Nurse Communication: Mobility status PT Visit Diagnosis: Unsteadiness on feet (R26.81)     Time: 1125-1140 PT Time Calculation (min) (ACUTE ONLY): 15 min  Charges:  $Therapeutic Activity: 8-22 mins                     Tresa Endo PT Acute Rehabilitation Services Pager [phone] Office [phone]    Claretha Cooper 06/16/2018,  11:48 AM
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
## 2018-06-16 NOTE — Progress Notes (Signed)
  Speech Language Pathology Treatment: Dysphagia  Patient Details Name: Gina Edwards MRN: [id number] DOB: April 22, 1941 Today's Date: 06/16/2018 Time: 4967-5916 SLP Time Calculation (min) (ACUTE ONLY): 11 min  Assessment / Plan / Recommendation Clinical Impression  Pt was alert and accepting of POs, but needed Mod-Max cues for awareness, sustained attention, and sue of safety precautions. She has poor labial seal with thin liquids via cup/spoon and in reacting to anterior spillage, also has immediate coughing that is concerning for premature spillage. Pt needed to be in a very upright position and be given very small amounts of POs not to have coughing with intake. Unfortunately given overall mentation, risk is likely to fluctuate throughout the day, and she is also not a candidate for PEG (confirmed in conversation with palliative care MD), therefore would continue with current diet textures to see if pt can start to increase her amount of PO intake. Would focus on frequent oral care, careful assistance with feeding, and strict use of aspiration precautions. Will f/u for additional education as able.   HPI HPI: Pt was admitted for dementia with behavioral disturbance, Dx of UTI. PMH: anxiety and depression      SLP Plan  Continue with current plan of care       Recommendations  Diet recommendations: Dysphagia 1 (puree);Thin liquid Liquids provided via: Teaspoon;Cup(siphon from straw; swabs) Medication Administration: Whole meds with puree Supervision: Staff to assist with self feeding;Full supervision/cueing for compensatory strategies Compensations: Slow rate;Small sips/bites;Other (Comment)(assure pt swallows before giving more) Postural Changes and/or Swallow Maneuvers: Seated upright 90 degrees                Oral Care Recommendations: Oral care BID Follow up Recommendations: Skilled Nursing facility SLP Visit Diagnosis: Dysphagia, oropharyngeal phase (R13.12) Plan: Continue  with current plan of care       GO                Germain Osgood 06/16/2018, 4:35 PM  Germain Osgood, M.A. [place] Acute Environmental education officer [phone] Office [phone]

## 2018-06-17 MED ORDER — DIVALPROEX SODIUM 125 MG PO CSDR: 250.0000 mg | DELAYED_RELEASE_CAPSULE | Freq: Every day | ORAL | 0 refills | Status: AC

## 2018-06-17 MED ORDER — OLANZAPINE 2.5 MG PO TABS: 2.5000 mg | ORAL_TABLET | Freq: Every day | ORAL | 0 refills | Status: DC | PRN

## 2018-06-17 MED ORDER — CLONAZEPAM 0.5 MG PO TABS: 0.2500 mg | ORAL_TABLET | Freq: Every day | ORAL | 0 refills | Status: AC

## 2018-06-17 MED ORDER — HALOPERIDOL LACTATE 5 MG/ML IJ SOLN: 1.0000 mg | Freq: Four times a day (QID) | INTRAMUSCULAR | 0 refills | Status: DC | PRN
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
## 2018-06-17 NOTE — Progress Notes (Signed)
Pt's son and daughter-in-law have expressed desire to have hospice services follow pt after discussion with PMT yesterday.  Discussed with SNF [facility]), and pt approved to admit with hospice, pt has long term care insurance policy to assist with cost of care there. Pt's son/daughter-in-law not available today, son deploying today and daughter-in-law planning to go to facility to complete necessary admission tasks and prepare pt's room for her move-in. Will plan for pt to DC and transition to [facility] tomorrow 06/18/18. Will follow to assist.   Sharren Bridge, MSW, LCSW Clinical Social Work 06/17/2018 [phone]

## 2018-06-17 NOTE — Progress Notes (Signed)
OT Cancellation Note  Patient Details Name: Gina Edwards MRN: [id number] DOB: 1940/10/06   Cancelled Treatment:    Reason Eval/Treat Not Completed: Other (comment). Noted plan is long term care with hospice. Will sign off  Makena Murdock 06/17/2018, 8:05 AM  Lesle Chris, OTR/L Acute Rehabilitation Services [phone] [facility] pager [phone] office 06/17/2018

## 2018-06-17 NOTE — Care Management Important Message (Signed)
Important Message  Patient Details  Name: Gina Edwards MRN: [id number] Date of Birth: 07/17/1941   Medicare Important Message Given:  Yes    Kerin Salen 06/17/2018, 10:19 AMImportant Message  Patient Details  Name: Gina Edwards MRN: [id number] Date of Birth: June 12, 1941   Medicare Important Message Given:  Yes    Kerin Salen 06/17/2018, 10:19 AM

## 2018-06-17 NOTE — Consult Note (Signed)
P                                                                                    Consultation Note Date: 06/17/2018   Patient Name: Gina Edwards  DOB: 11-30-1940  MRN: [id number]  Age / Sex: 77 y.o., female  PCP: Shon Baton, MD Referring Physician: Georgette Shell, MD  Reason for Consultation: Establishing goals of care  HPI/Patient Profile: 77 y.o. female  with past medical history of dementia admitted on 05/12/2018 and with prolonged hospital course related to agitation.  Palliative consulted for goals of care.   Clinical Assessment and Goals of Care: I saw and examined Ms. Makar.  She is awake but confused and cannot answer questions nor does she have insight into her condition.  I called and was able to reach her son, Gerald Stabs and her daughter in law via phone.  I introduced palliative care as specialized medical care for people living with serious illness. It focuses on providing relief from the symptoms and stress of a serious illness. The goal is to improve quality of life for both the patient and the family.  We discussed clinical course as well as wishes moving forward in regard to advanced directives.  Concepts specific to code status and rehospitalization discussed.  We discussed difference between a aggressive medical intervention path and a palliative, comfort focused care path.  Values and goals of care important to patient and family were attempted to be elicited.  We discussed continued decline that she has experienced in cognition, nutrition and functional status, including recent worsening of these domains since her hospitalization.  Her son reports understanding that she has a terminal illness with her dementia and is concerned about her living what is left of her life with comfort and dignity.  Creating a good plan for this is extremely important to him as he is Fish farm manager.  Concept of Hospice and Palliative Care were discussed  Questions and  concerns addressed.   PMT will continue to support holistically.  SUMMARY OF RECOMMENDATIONS    We reviewed/completed MOST form today on the phone. DNR, Comfort care, IVF and ABX if indicated, no feeding tube.  He will review when he arrives and sign and give to nurse.  Plan for transition to long term care with hospice.  Appreciate SW assistance.  Code Status/Advance Care Planning:  DNR   Symptom Management:   Agitation: continue current.  Consider further titration of depakote as necessary.  Palliative Prophylaxis:   Bowel Regimen, Delirium Protocol and Frequent Pain Assessment  Additional Recommendations (Limitations, Scope, Preferences):  Full Comfort Care  Psycho-social/Spiritual:   Desire for further Chaplaincy support:no  Additional Recommendations: Education on Hospice  Prognosis:   < 6 months  Discharge Planning: [place] with Hospice      Primary Diagnoses: Present on Admission: . Dementia with behavioral disturbance ([place]) . Acute lower UTI . Aggressive behavior . Sinusitis . UTI (urinary tract infection) . Pansinusitis   I have reviewed the medical record, interviewed the patient and family, and examined the patient. The following aspects are pertinent.  Past Medical History:  Diagnosis Date  . Anxiety 12/02/2012  .  Anxiety and depression   . Dementia ([place]) 12/02/2012  . Depression 12/02/2012  . H/O meningioma of the brain 12/02/2012  . SUI (stress urinary incontinence, female)    Social History   Socioeconomic History  . Marital status: Married    Spouse name: Not on file  . Number of children: Not on file  . Years of education: Not on file  . Highest education level: Not on file  Occupational History  . Not on file  Social Needs  . Financial resource strain: Not on file  . Food insecurity:    Worry: Not on file    Inability: Not on file  . Transportation needs:    Medical: Not on file    Non-medical: Not on file    Tobacco Use  . Smoking status: Never Smoker  . Smokeless tobacco: Never Used  Substance and Sexual Activity  . Alcohol use: No  . Drug use: No  . Sexual activity: Yes    Partners: Male    Birth control/protection: Post-menopausal  Lifestyle  . Physical activity:    Days per week: Not on file    Minutes per session: Not on file  . Stress: Not on file  Relationships  . Social connections:    Talks on phone: Not on file    Gets together: Not on file    Attends religious service: Not on file    Active member of club or organization: Not on file    Attends meetings of clubs or organizations: Not on file    Relationship status: Not on file  Other Topics Concern  . Not on file  Social History Narrative   Pt live at home with spouse.    Caffeine Use- 24oz   Family History  Problem Relation Age of Onset  . Heart disease Mother   . Colon cancer Mother 1  . Prostate cancer Father    Scheduled Meds: . cholecalciferol  2,000 Units Oral Daily  . clonazePAM  0.25 mg Oral Daily  . divalproex  250 mg Oral Daily  . divalproex  500 mg Oral QHS  . enoxaparin (LOVENOX) injection  40 mg Subcutaneous Daily  . escitalopram  10 mg Oral Daily  . feeding supplement (ENSURE ENLIVE)  237 mL Oral BID BM  . haloperidol lactate  5 mg Intravenous Once  . mouth rinse  15 mL Mouth Rinse BID  . OLANZapine  2.5 mg Oral Daily   And  . OLANZapine  5 mg Oral QHS  . senna-docusate  1 tablet Oral QHS   Continuous Infusions: . dextrose 5 % and 0.45% NaCl 75 mL/hr at 06/17/18 0617   PRN Meds:.acetaminophen **OR** acetaminophen, haloperidol lactate, HYDROcodone-acetaminophen, OLANZapine, ondansetron **OR** ondansetron (ZOFRAN) IV, polyethylene glycol Medications Prior to Admission:  Prior to Admission medications   Medication Sig Start Date End Date Taking? Authorizing Provider  ARIPiprazole (ABILIFY) 5 MG tablet Take 5 mg by mouth daily.  11/29/12  Yes [provider]  cholecalciferol  (VITAMIN D) 1000 units tablet Take 2,000 Units by mouth daily.   Yes [provider]  escitalopram (LEXAPRO) 10 MG tablet Take 10 mg by mouth daily.  09/14/12  Yes [provider]  cephALEXin (KEFLEX) 500 MG capsule Take 1 capsule (500 mg total) by mouth 3 (three) times daily. Patient not taking: Reported on 05/12/2018 01/24/18   Varney Biles, MD  clonazePAM (KLONOPIN) 0.5 MG tablet Take 0.5 tablets (0.25 mg total) by mouth daily. 06/17/18   Georgette Shell, MD  divalproex (DEPAKOTE SPRINKLE) 125 MG capsule Take 2 capsules (250 mg total) by mouth daily. 06/17/18   Georgette Shell, MD  haloperidol lactate (HALDOL) 5 MG/ML injection Inject 0.2 mLs (1 mg total) into the vein every 6 (six) hours as needed. 06/17/18   Georgette Shell, MD  HYDROcodone-acetaminophen (NORCO) 5-325 MG tablet Take 1 tablet by mouth every 6 (six) hours as needed. Patient not taking: Reported on 05/12/2018 02/06/18   Maczis, Barth Kirks, PA-C  OLANZapine (ZYPREXA) 2.5 MG tablet Take 1 tablet (2.5 mg total) by mouth daily as needed (agitation). 06/17/18   Georgette Shell, MD  ondansetron (ZOFRAN) 4 MG tablet Take 1 tablet (4 mg total) by mouth every 8 (eight) hours as needed for nausea or vomiting. Patient not taking: Reported on 05/12/2018 02/06/18   Maczis, Barth Kirks, PA-C  potassium chloride (KLOR-CON) 20 MEQ packet Take 20 mEq by mouth once for 1 dose. 02/06/18 02/06/18  Maczis, Barth Kirks, PA-C   Allergies  Allergen Reactions  . Ciprofloxacin   . Morphine And Related    Review of Systems  Unable to obtain  Physical Exam General: Sleepy but arouses.  HEENT: No bruits, no goiter, no JVD Heart: Regular rate and rhythm. No murmur appreciated. Lungs: Fair air movement, clear Abdomen: Soft, nontender, nondistended, positive bowel sounds.  Ext: No significant edema Skin: Warm and dry Neuro: Confused. Moves 4 extremities but does not follow commands  Vital Signs: BP 103/64 (BP Location: Left  Arm)   Pulse (!) 56   Temp (!) 97.5 F (36.4 C) (Oral)   Resp 16   Wt 65.4 kg   SpO2 100%   BMI 23.27 kg/m  Pain Scale: PAINAD   Pain Score: 0-No pain   SpO2: SpO2: 100 % O2 Device:SpO2: 100 % O2 Flow Rate: .O2 Flow Rate (L/min): 0 L/min  IO: Intake/output summary:   Intake/Output Summary (Last 24 hours) at 06/17/2018 1413 Last data filed at 06/17/2018 1251 Gross per 24 hour  Intake 1893.09 ml  Output -  Net 1893.09 ml    LBM: Last BM Date: 06/14/18 Baseline Weight: Weight: 54.6 kg Most recent weight: Weight: 65.4 kg     Palliative Assessment/Data:     Time in: 1500 Time out:1620  Time Total: 80 Greater than 50%  of this time was spent counseling and coordinating care related to the above assessment and plan.  Signed by: Micheline Rough, MD   Please contact Palliative Medicine Team phone at [phone] for questions and concerns.  For individual provider: See Shea Evans
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
## 2018-06-18 DIAGNOSIS — Z515 Encounter for palliative care: Secondary | ICD-10-CM | POA: Diagnosis not present

## 2018-06-18 DIAGNOSIS — R1311 Dysphagia, oral phase: Secondary | ICD-10-CM | POA: Diagnosis present

## 2018-06-18 DIAGNOSIS — E46 Unspecified protein-calorie malnutrition: Secondary | ICD-10-CM | POA: Diagnosis not present

## 2018-06-18 DIAGNOSIS — R413 Other amnesia: Secondary | ICD-10-CM | POA: Diagnosis not present

## 2018-06-18 DIAGNOSIS — R0603 Acute respiratory distress: Secondary | ICD-10-CM | POA: Diagnosis present

## 2018-06-18 DIAGNOSIS — R0902 Hypoxemia: Secondary | ICD-10-CM | POA: Diagnosis not present

## 2018-06-18 DIAGNOSIS — R2689 Other abnormalities of gait and mobility: Secondary | ICD-10-CM | POA: Diagnosis present

## 2018-06-18 DIAGNOSIS — R682 Dry mouth, unspecified: Secondary | ICD-10-CM | POA: Diagnosis not present

## 2018-06-18 DIAGNOSIS — M6281 Muscle weakness (generalized): Secondary | ICD-10-CM | POA: Diagnosis present

## 2018-06-18 DIAGNOSIS — O479 False labor, unspecified: Secondary | ICD-10-CM | POA: Diagnosis not present

## 2018-06-18 DIAGNOSIS — F0391 Unspecified dementia with behavioral disturbance: Secondary | ICD-10-CM | POA: Diagnosis present

## 2018-06-18 DIAGNOSIS — Z23 Encounter for immunization: Secondary | ICD-10-CM | POA: Diagnosis not present

## 2018-06-18 DIAGNOSIS — R5381 Other malaise: Secondary | ICD-10-CM | POA: Diagnosis not present

## 2018-06-18 DIAGNOSIS — J690 Pneumonitis due to inhalation of food and vomit: Secondary | ICD-10-CM | POA: Diagnosis present

## 2018-06-18 DIAGNOSIS — M2550 Pain in unspecified joint: Secondary | ICD-10-CM | POA: Diagnosis not present

## 2018-06-18 DIAGNOSIS — Z7401 Bed confinement status: Secondary | ICD-10-CM | POA: Diagnosis not present

## 2018-06-18 DIAGNOSIS — R531 Weakness: Secondary | ICD-10-CM | POA: Diagnosis not present

## 2018-06-18 DIAGNOSIS — R4182 Altered mental status, unspecified: Secondary | ICD-10-CM | POA: Diagnosis not present

## 2018-06-18 DIAGNOSIS — R062 Wheezing: Secondary | ICD-10-CM | POA: Diagnosis not present

## 2018-06-18 LAB — CBC
HCT: 51.6 % — ABNORMAL HIGH (ref 36.0–46.0)
Hemoglobin: 16.4 g/dL — ABNORMAL HIGH (ref 12.0–15.0)
MCH: 29.7 pg (ref 26.0–34.0)
MCHC: 31.8 g/dL (ref 30.0–36.0)
MCV: 93.5 fL (ref 80.0–100.0)
PLATELETS: 142 10*3/uL — AB (ref 150–400)
RBC: 5.52 MIL/uL — AB (ref 3.87–5.11)
RDW: 14.1 % (ref 11.5–15.5)
WBC: 5.6 10*3/uL (ref 4.0–10.5)
nRBC: 0 % (ref 0.0–0.2)

## 2018-06-18 LAB — BASIC METABOLIC PANEL
ANION GAP: 7 (ref 5–15)
BUN: 12 mg/dL (ref 8–23)
CALCIUM: 8.7 mg/dL — AB (ref 8.9–10.3)
CO2: 29 mmol/L (ref 22–32)
CREATININE: 0.49 mg/dL (ref 0.44–1.00)
Chloride: 108 mmol/L (ref 98–111)
GFR calc Af Amer: >60 mL/min (ref >60)
GLUCOSE: 94 mg/dL (ref 70–99)
Potassium: 3.8 mmol/L (ref 3.5–5.1)
Sodium: 144 mmol/L (ref 135–145)

## 2018-06-18 MED ORDER — LIP MEDEX EX OINT
TOPICAL_OINTMENT | CUTANEOUS | Status: AC
  Filled 2018-06-18: qty 7

## 2018-06-18 MED ORDER — HALOPERIDOL LACTATE 5 MG/ML IJ SOLN: 1.0000 mg | Freq: Four times a day (QID) | INTRAMUSCULAR | 0 refills | Status: AC | PRN

## 2018-06-18 MED ORDER — OLANZAPINE 2.5 MG PO TABS: 2.5000 mg | ORAL_TABLET | Freq: Every day | ORAL | 0 refills | Status: AC | PRN

## 2018-06-18 NOTE — Progress Notes (Signed)
Pt discharged to facility via [facility]. Report called to facility. DC packed given to PTAR.

## 2018-06-18 NOTE — Progress Notes (Signed)
Report called to Aileen Fass, Therapist, sports at [facility] facility.

## 2018-06-18 NOTE — Clinical Social Work Placement (Signed)
Pt admitting to [facility] room 103- report [phone]. Family has decided to have hospice follow at facility rather than pursue rehabilitation due to pt's ongoing barriers to beneficial rehab efforts. Daughter-in-law preparing pt's belongings at facility and once complete, CSW was arrange PTAR transportation.   CLINICAL SOCIAL WORK PLACEMENT  NOTE  Date:  06/18/2018  Patient Details  Name: Gina Edwards MRN: [id number] Date of Birth: June 15, 1941  Clinical Social Work is seeking post-discharge placement for this patient at the [place] level of care (*CSW will initial, date and re-position this form in  chart as items are completed):  Yes   Patient/family provided with [facility] Work Department's list of facilities offering this level of care within the geographic area requested by the patient (or if unable, by the patient's family).  Yes   Patient/family informed of their freedom to choose among providers that offer the needed level of care, that participate in Medicare, Medicaid or managed care program needed by the patient, have an available bed and are willing to accept the patient.  Yes   Patient/family informed of [facility]'s ownership interest in [facility] and [facility], as well as of the fact that they are under no obligation to receive care at these facilities.  PASRR submitted to EDS on 06/04/18     PASRR number received on 06/08/18     Existing PASRR number confirmed on       FL2 transmitted to all facilities in geographic area requested by pt/family on 06/07/18     FL2 transmitted to all facilities within larger geographic area on       Patient informed that his/her managed care company has contracts with or will negotiate with certain facilities, including the following:        Yes   Patient/family informed of bed offers received.  Patient chooses bed at [facility])     Physician recommends and patient  chooses bed at [facility])    Patient to be transferred to [facility]) on 06/18/18.  Patient to be transferred to facility by PTAR     Patient family notified on 06/18/18 of transfer.  Name of family member notified:  Daughter in law [place]       Additional Comment:    _______________________________________________ Nila Nephew, LCSW 06/18/2018, 9:26 AM [phone]

## 2018-06-21 ENCOUNTER — Non-Acute Institutional Stay: Payer: Medicare Other | Admitting: Internal Medicine

## 2018-06-21 DIAGNOSIS — Z515 Encounter for palliative care: Secondary | ICD-10-CM | POA: Diagnosis not present

## 2018-06-21 DIAGNOSIS — R531 Weakness: Secondary | ICD-10-CM | POA: Diagnosis not present

## 2018-06-21 DIAGNOSIS — R413 Other amnesia: Secondary | ICD-10-CM | POA: Diagnosis not present

## 2018-06-24 DIAGNOSIS — R5381 Other malaise: Secondary | ICD-10-CM | POA: Diagnosis not present

## 2018-06-24 DIAGNOSIS — F0391 Unspecified dementia with behavioral disturbance: Secondary | ICD-10-CM | POA: Diagnosis not present

## 2018-06-25 ENCOUNTER — Non-Acute Institutional Stay: Payer: Self-pay | Admitting: Internal Medicine

## 2018-06-25 DIAGNOSIS — E46 Unspecified protein-calorie malnutrition: Secondary | ICD-10-CM | POA: Diagnosis not present

## 2018-06-25 DIAGNOSIS — R531 Weakness: Secondary | ICD-10-CM | POA: Diagnosis not present

## 2018-06-25 DIAGNOSIS — R413 Other amnesia: Secondary | ICD-10-CM | POA: Diagnosis not present

## 2018-06-25 DIAGNOSIS — Z515 Encounter for palliative care: Secondary | ICD-10-CM | POA: Diagnosis not present

## 2018-06-28 DIAGNOSIS — F0391 Unspecified dementia with behavioral disturbance: Secondary | ICD-10-CM | POA: Diagnosis not present

## 2018-06-28 DIAGNOSIS — R0603 Acute respiratory distress: Secondary | ICD-10-CM | POA: Diagnosis not present

## 2018-06-28 DIAGNOSIS — J690 Pneumonitis due to inhalation of food and vomit: Secondary | ICD-10-CM | POA: Diagnosis not present

## 2018-06-29 ENCOUNTER — Non-Acute Institutional Stay: Payer: Medicare Other | Admitting: Licensed Clinical Social Worker

## 2018-06-29 DIAGNOSIS — Z515 Encounter for palliative care: Secondary | ICD-10-CM

## 2018-06-30 ENCOUNTER — Non-Acute Institutional Stay: Payer: Medicare Other | Admitting: Internal Medicine

## 2018-06-30 DIAGNOSIS — R682 Dry mouth, unspecified: Secondary | ICD-10-CM | POA: Diagnosis not present

## 2018-06-30 DIAGNOSIS — R531 Weakness: Secondary | ICD-10-CM

## 2018-06-30 DIAGNOSIS — R413 Other amnesia: Secondary | ICD-10-CM

## 2018-06-30 DIAGNOSIS — Z7189 Other specified counseling: Secondary | ICD-10-CM

## 2018-06-30 DIAGNOSIS — J690 Pneumonitis due to inhalation of food and vomit: Secondary | ICD-10-CM | POA: Diagnosis not present

## 2018-06-30 DIAGNOSIS — R0603 Acute respiratory distress: Secondary | ICD-10-CM | POA: Diagnosis not present

## 2018-06-30 DIAGNOSIS — F0391 Unspecified dementia with behavioral disturbance: Secondary | ICD-10-CM | POA: Diagnosis not present

## 2018-06-30 NOTE — Progress Notes (Signed)
COMMUNITY PALLIATIVE CARE SW NOTE  PATIENT NAME: Gina Edwards DOB: [birth year] MRN: [id number]  PRIMARY CARE PROVIDER: Shon Baton, MD  RESPONSIBLE PARTY:  Acct ID - Guarantor Home Phone Work Phone Relationship Acct Type  [id number] - Evers,Laniyah* [phone]  Self P/F     [place], [place], [place] [address]     PLAN OF CARE and INTERVENTIONS:             1. GOALS OF CARE/ ADVANCE CARE PLANNING:  Goal is for patient to remain at the facility.  Patient is a DNR. 2. SOCIAL/EMOTIONAL/SPIRITUAL ASSESSMENT/ INTERVENTIONS:  SW met with patient at [facility] in her room.  Also consulted facility staff, [place].  Patient was lying in bed with IV fluids.  She did not respond to verbal prompts.  She was mouth breathing.  Patient did not display any nonverbal indicators of pain.  Tameka reported patient's son had been deployed. 3. PATIENT/CAREGIVER EDUCATION/ COPING:  SW provided education to staff regarding the Palliative Care Program. 4. PERSONAL EMERGENCY PLAN:  Per facility protocol. 5. COMMUNITY RESOURCES COORDINATION/ HEALTH CARE NAVIGATION:  None. 6. FINANCIAL/LEGAL CONCERNS/INTERVENTIONS:  None.     SOCIAL HX:  Social History   Tobacco Use  . Smoking status: Never Smoker  . Smokeless tobacco: Never Used  Substance Use Topics  . Alcohol use: No    CODE STATUS:  DNR  ADVANCED DIRECTIVES: N MOST FORM COMPLETE:  N HOSPICE EDUCATION PROVIDED: Y PPS:  Patient has not eaten in two days.  She is total care. Duration of visit and documentation:  45 minutes.      Creola Corn Mccall Will, LCSW

## 2018-07-01 NOTE — Progress Notes (Signed)
Community Palliative Care Telephone: [phone] Fax: [phone]  PATIENT NAME: Gina Edwards DOB: 1941/07/21 MRN: [id number]  PRIMARY CARE PROVIDER:   Shon Baton, MD  REFERRING PROVIDER:  Dr. Felicity Coyer, NP  RESPONSIBLE PARTY:  Gina Edwards (son) [phone], Gina Edwards) [phone]    RECOMMENDATIONS and PLAN:  1. Memory Loss R41.3:  End stage dementia.  FAST stage 7c with progressive cognitive and functional decline.  Transition to Hospice services for comfort care.  2.  Weakness R53.1:  Multifactorial as related to end stage dementia and probable aspiration pneumonia with additional decline of health.  End of life trajectory. Comfort measures. Complete antibiotics.  3. Advanced care planning Z71.89:  Discussion with son at bedside who is interested in comfort care for patient to include transition to Hospice care services when possible.  DNAR and MOST forms designating comfort care previously completed by son during last hospitalization. Conversation with Gina Gross NP, Social worker Gina Edwards and Dr. Gildardo Edwards who are in agreement with appropiateness for Hospice.  Order received for Hospice referral to [facility].   Son will discuss plans with snf  business office on 11/21.  He also expressed interest in hospice house. Informed son that hospice services can be provided at snf and she is not exhibiting any signs of distress at this time.  All  Questions answered and he states that he will be available for any further planning.  Support given.  I spent 30 minutes providing this consultation,  from 4:30pm to 5:00pm at [facility]. More than 50% of the time in this consultation was spent coordinating communication with son, NP, SW and clinical staff.Marland Kitchen   HISTORY OF PRESENT ILLNESS: Follow-up with Gina Edwards. Staff reports that she remains bed-bound, NPO x 2 days, continues to have a fever and has decreased urinary output.  Unknown date of last BM.   She is not taking any meds except antibiotics and IV fluids. Admission wt on 11/8 was 115# and on 11/19 was 112#.   Her son arrived today from his base in [place] with the assistance of [employer] and is at bedside. He is pending deportation to [place].   CODE STATUS: DNAR/DNI  PPS: 10% HOSPICE ELIGIBILITY/DIAGNOSIS: YES/ Advanced dementia with behaviors/pneumonia  PAST MEDICAL HISTORY:  Past Medical History:  Diagnosis Date  . Anxiety 12/02/2012  . Anxiety and depression   . Dementia ([place]) 12/02/2012  . Depression 12/02/2012  . H/O meningioma of the brain 12/02/2012  . SUI (stress urinary incontinence, female)     SOCIAL HX:  Social History   Tobacco Use  . Smoking status: Never Smoker  . Smokeless tobacco: Never Used  Substance Use Topics  . Alcohol use: No    ALLERGIES:  Allergies  Allergen Reactions  . Ciprofloxacin   . Morphine And Related      PERTINENT MEDICATIONS:  Outpatient Encounter Medications as of 06/30/2018  Medication Sig  . ARIPiprazole (ABILIFY) 5 MG tablet Take 5 mg by mouth daily.   . clonazePAM (KLONOPIN) 0.5 MG tablet Take 0.5 tablets (0.25 mg total) by mouth daily.  . divalproex (DEPAKOTE SPRINKLE) 125 MG capsule Take 2 capsules (250 mg total) by mouth daily.  Marland Kitchen escitalopram (LEXAPRO) 10 MG tablet Take 10 mg by mouth daily.   . haloperidol lactate (HALDOL) 5 MG/ML injection Inject 0.2 mLs (1 mg total) into the vein every 6 (six) hours as needed.  Marland Kitchen OLANZapine (ZYPREXA) 2.5 MG tablet Take 1 tablet (2.5 mg total) by mouth daily  as needed (agitation).   No facility-administered encounter medications on file as of 06/30/2018.     PHYSICAL EXAM:   BP 108/60  P 112  R20  T 99.3  O2 sats 92% on 2L O2  General: Chronically ill, frail appearing, thin.  In NAD ENT:  Dry mucus membranes Cardiovascular: rapid rate and regular rhythm Pulmonary: diminished breath sounds anteriorly.  O2 via Bland in use. Abdomen: soft, nontender, + bowel sounds GU: no  suprapubic tenderness Extremities: no edema, no joint deformities.  Decreased muscle mass.  Peripheral IV site LUE with D5NS infusing at 50cc/hr Skin: poor skin turgor Neurological: Awake with good eye contact intermittently.  Jibberish speech.  Does not follow commands.  Calm mood.  Gina Lex, NP-C

## 2018-07-02 ENCOUNTER — Telehealth: Payer: Self-pay | Admitting: Internal Medicine

## 2018-07-02 DIAGNOSIS — R531 Weakness: Secondary | ICD-10-CM | POA: Insufficient documentation

## 2018-07-02 DIAGNOSIS — F411 Generalized anxiety disorder: Secondary | ICD-10-CM | POA: Diagnosis not present

## 2018-07-02 DIAGNOSIS — F333 Major depressive disorder, recurrent, severe with psychotic symptoms: Secondary | ICD-10-CM | POA: Diagnosis not present

## 2018-07-02 DIAGNOSIS — E43 Unspecified severe protein-calorie malnutrition: Secondary | ICD-10-CM | POA: Diagnosis not present

## 2018-07-02 DIAGNOSIS — J690 Pneumonitis due to inhalation of food and vomit: Secondary | ICD-10-CM | POA: Diagnosis not present

## 2018-07-02 DIAGNOSIS — R413 Other amnesia: Secondary | ICD-10-CM | POA: Insufficient documentation

## 2018-07-02 DIAGNOSIS — Z515 Encounter for palliative care: Secondary | ICD-10-CM | POA: Insufficient documentation

## 2018-07-02 DIAGNOSIS — R1310 Dysphagia, unspecified: Secondary | ICD-10-CM | POA: Diagnosis not present

## 2018-07-02 DIAGNOSIS — G309 Alzheimer's disease, unspecified: Secondary | ICD-10-CM | POA: Diagnosis not present

## 2018-07-02 NOTE — Telephone Encounter (Signed)
Received call from Penn Wynne at [facility] with updates related to patient.  She passed the phone to Merrily Brittle, NP for further discussion.  Ms. Levada Dy reports that patient's status has declined, she is very congested with a cough, is febrile, less responsive and much weaker. She was asking for guidance.  Staff had been unable to contact daughter-in-law or son.  I phoned son Gerald Stabs and added him to our conversation.  He was updated on his mother's status and agreed with CXR and treatment with antibiotics for treatment of a probable pneumonia.  He stated that he did not want her to return to the hospital and understood that she may be approaching end of life.  Our plan is to contact the [employer] in attempts to have pt's son transported to [place] from [place] due to the acute decline of her health. He is normally not expected to arrive in [place] until 07/07/18 and deploy to [place] on 07/15/18. Later contacted daughter-in-law Edmonia Lynch who will have to give specific information to the [employer] to make the request for release for travel.  She stated that she will follow through with same plan.  Contact information for [facility] provided.

## 2018-07-02 NOTE — Progress Notes (Signed)
[facility] Telephone: [phone] Fax: [phone]  PATIENT NAME: Gina Edwards DOB: [birth year] MRN: [id number]  PRIMARY CARE PROVIDER:   Shon Baton, MD  REFERRING PROVIDER:  Dr. Felicity Coyer, NP--[facility]  RESPONSIBLE PARTY:   Tawni Levy) [phone]     RECOMMENDATIONS and PLAN:  1.  Weakness R53.1:  Deconditioned and rapid cognitive and functional decline.  Attempt to decrease sedatives slowly.  Initial plan to d/c Abilify.  Supportive care.  2.  Memory Loss R41.3:  FAST stage 7C.  Monitor for new baseline after decrease of medicinal load.    3.  Palliative care encounter Z51.5:  DNAR/DNI  MOST form completed during recent hospitalization.  Comfort measures and no feeding tube.  No family at bedside.  Phone contact with daughter-in-law and son via phone.  Son(only child) currently stationed in [place] with plans for deployment to [place] on 06/27/18.  He agrees with plans for attempt to decrease sedation and monitor patient status.  Palliative care will continue to follow patient and update son or daughter in law.  He was appreciative of conversation.   I spent 15 minutes providing this consultation,  from 4:30pm to 4:45pm at [facility] More than 50% of the time in this consultation was spent coordinating communication NP, social worker and clinical staff.   HISTORY OF PRESENT ILLNESS:  Gina Edwards is a 77 y.o. year old female with multiple medical problems including progressive dementia with behavioral disturbance. She normally lives at home with her husband and is ambulatory, feeds self performs ADLs with assistance but is incontinent.  Staff and records  Report hospitalization from 10/2-11/8/19 due to increase aggressive behaviors and a UTI. Pt's husband expired under the care of Hospice while patient was hospitalized.  Her cognitive and functional abilities have declined since hospitalization.  She is non-ambulatory, requires feeding,  speech is limited, incontinent of B&B and is total care for all ADLs.  Nutritional intake ranges from 20-40%.  Her hospital weight is listed as 144# at 5'6" (bmi of 65.4) Her facility admission weight is listed as 115#(bmi 18.6)  29 # weight loss over this past month.  Reports of increased sleeping. Palliative Care was asked to help address goals of care and advanced care planning.   CODE STATUS:  DNAR/DNI  PPS: 20% HOSPICE ELIGIBILITY/DIAGNOSIS: TBD  PAST MEDICAL HISTORY:  Past Medical History:  Diagnosis Date  . Anxiety 12/02/2012  . Anxiety and depression   . Dementia ([place]) 12/02/2012  . Depression 12/02/2012  . H/O meningioma of the brain 12/02/2012  . SUI (stress urinary incontinence, female)     SOCIAL HX:  Social History   Tobacco Use  . Smoking status: Never Smoker  . Smokeless tobacco: Never Used  Substance Use Topics  . Alcohol use: No    ALLERGIES:  Allergies  Allergen Reactions  . Ciprofloxacin   . Morphine And Related      PERTINENT MEDICATIONS:  Outpatient Encounter Medications as of 06/21/2018  Medication Sig  . ARIPiprazole (ABILIFY) 5 MG tablet Take 5 mg by mouth daily.   . clonazePAM (KLONOPIN) 0.5 MG tablet Take 0.5 tablets (0.25 mg total) by mouth daily.  . divalproex (DEPAKOTE SPRINKLE) 125 MG capsule Take 2 capsules (250 mg total) by mouth daily.  Marland Kitchen escitalopram (LEXAPRO) 10 MG tablet Take 10 mg by mouth daily.   . haloperidol lactate (HALDOL) 5 MG/ML injection Inject 0.2 mLs (1 mg total) into the vein every 6 (six) hours as needed.  Marland Kitchen  OLANZapine (ZYPREXA) 2.5 MG tablet Take 1 tablet (2.5 mg total) by mouth daily as needed (agitation).   No facility-administered encounter medications on file as of 06/21/2018.     PHYSICAL EXAM:   General: Chronically ill, frail appearing, thin.  In NAD Cardiovascular: regular rate and rhythm Pulmonary: clear ant fields Abdomen: soft, nontender, + bowel sounds GU: no suprapubic tenderness Extremities: no edema or  ecchymosis.  IV access LUE. Skin: exposed skin is intact Neurological: Somnolent. Grunts to sternal rub  Gonzella Lex, NP-C

## 2018-07-03 DIAGNOSIS — R1310 Dysphagia, unspecified: Secondary | ICD-10-CM | POA: Diagnosis not present

## 2018-07-03 DIAGNOSIS — F333 Major depressive disorder, recurrent, severe with psychotic symptoms: Secondary | ICD-10-CM | POA: Diagnosis not present

## 2018-07-03 DIAGNOSIS — J690 Pneumonitis due to inhalation of food and vomit: Secondary | ICD-10-CM | POA: Diagnosis not present

## 2018-07-03 DIAGNOSIS — G309 Alzheimer's disease, unspecified: Secondary | ICD-10-CM | POA: Diagnosis not present

## 2018-07-03 DIAGNOSIS — E43 Unspecified severe protein-calorie malnutrition: Secondary | ICD-10-CM | POA: Diagnosis not present

## 2018-07-03 DIAGNOSIS — F411 Generalized anxiety disorder: Secondary | ICD-10-CM | POA: Diagnosis not present

## 2018-07-04 DIAGNOSIS — E43 Unspecified severe protein-calorie malnutrition: Secondary | ICD-10-CM | POA: Diagnosis not present

## 2018-07-04 DIAGNOSIS — J690 Pneumonitis due to inhalation of food and vomit: Secondary | ICD-10-CM | POA: Diagnosis not present

## 2018-07-04 DIAGNOSIS — R1310 Dysphagia, unspecified: Secondary | ICD-10-CM | POA: Diagnosis not present

## 2018-07-04 DIAGNOSIS — G309 Alzheimer's disease, unspecified: Secondary | ICD-10-CM | POA: Diagnosis not present

## 2018-07-04 DIAGNOSIS — F333 Major depressive disorder, recurrent, severe with psychotic symptoms: Secondary | ICD-10-CM | POA: Diagnosis not present

## 2018-07-04 DIAGNOSIS — F411 Generalized anxiety disorder: Secondary | ICD-10-CM | POA: Diagnosis not present

## 2018-07-05 DIAGNOSIS — R1310 Dysphagia, unspecified: Secondary | ICD-10-CM | POA: Diagnosis not present

## 2018-07-05 DIAGNOSIS — F411 Generalized anxiety disorder: Secondary | ICD-10-CM | POA: Diagnosis not present

## 2018-07-05 DIAGNOSIS — J690 Pneumonitis due to inhalation of food and vomit: Secondary | ICD-10-CM | POA: Diagnosis not present

## 2018-07-05 DIAGNOSIS — G309 Alzheimer's disease, unspecified: Secondary | ICD-10-CM | POA: Diagnosis not present

## 2018-07-05 DIAGNOSIS — E43 Unspecified severe protein-calorie malnutrition: Secondary | ICD-10-CM | POA: Diagnosis not present

## 2018-07-05 DIAGNOSIS — F333 Major depressive disorder, recurrent, severe with psychotic symptoms: Secondary | ICD-10-CM | POA: Diagnosis not present

## 2018-07-05 NOTE — Progress Notes (Unsigned)
[facility] Telephone: [phone] Fax: [phone]  PATIENT NAME: Gina Edwards DOB: 1940/08/18 MRN: [id number]  PRIMARY CARE PROVIDER:   Shon Baton, MD  REFERRING PROVIDER:  Shon Baton, MD [place], [place] [address]  RESPONSIBLE PARTY:     ASSESSMENT:        RECOMMENDATIONS and PLAN:  1.  I spent *** minutes providing this consultation,  from *** to ***. More than 50% of the time in this consultation was spent coordinating communication.   HISTORY OF PRESENT ILLNESS:  Gina Edwards is a 77 y.o. year old female with multiple medical problems including ***. Palliative Care was asked to help address goals of care.   CODE STATUS:   PPS: 0% HOSPICE ELIGIBILITY/DIAGNOSIS: TBD  PAST MEDICAL HISTORY:  Past Medical History:  Diagnosis Date  . Anxiety 12/02/2012  . Anxiety and depression   . Dementia ([place]) 12/02/2012  . Depression 12/02/2012  . H/O meningioma of the brain 12/02/2012  . SUI (stress urinary incontinence, female)     SOCIAL HX:  Social History   Tobacco Use  . Smoking status: Never Smoker  . Smokeless tobacco: Never Used  Substance Use Topics  . Alcohol use: No    ALLERGIES:  Allergies  Allergen Reactions  . Ciprofloxacin   . Morphine And Related      PERTINENT MEDICATIONS:  Outpatient Encounter Medications as of 06/25/2018  Medication Sig  . ARIPiprazole (ABILIFY) 5 MG tablet Take 5 mg by mouth daily.   . clonazePAM (KLONOPIN) 0.5 MG tablet Take 0.5 tablets (0.25 mg total) by mouth daily.  . divalproex (DEPAKOTE SPRINKLE) 125 MG capsule Take 2 capsules (250 mg total) by mouth daily.  Marland Kitchen escitalopram (LEXAPRO) 10 MG tablet Take 10 mg by mouth daily.   . haloperidol lactate (HALDOL) 5 MG/ML injection Inject 0.2 mLs (1 mg total) into the vein every 6 (six) hours as needed.  Marland Kitchen OLANZapine (ZYPREXA) 2.5 MG tablet Take 1 tablet (2.5 mg total) by mouth daily as needed (agitation).   No facility-administered encounter  medications on file as of 06/25/2018.     PHYSICAL EXAM:   General: NAD, frail appearing, thin Cardiovascular: regular rate and rhythm Pulmonary: clear ant fields Abdomen: soft, nontender, + bowel sounds GU: no suprapubic tenderness Extremities: no edema, no joint deformities Skin: no rashes Neurological: Weakness but otherwise nonfocal  Gonzella Lex, NP

## 2018-07-06 DIAGNOSIS — G309 Alzheimer's disease, unspecified: Secondary | ICD-10-CM | POA: Diagnosis not present

## 2018-07-06 DIAGNOSIS — E43 Unspecified severe protein-calorie malnutrition: Secondary | ICD-10-CM | POA: Diagnosis not present

## 2018-07-06 DIAGNOSIS — F411 Generalized anxiety disorder: Secondary | ICD-10-CM | POA: Diagnosis not present

## 2018-07-06 DIAGNOSIS — F333 Major depressive disorder, recurrent, severe with psychotic symptoms: Secondary | ICD-10-CM | POA: Diagnosis not present

## 2018-07-06 DIAGNOSIS — J690 Pneumonitis due to inhalation of food and vomit: Secondary | ICD-10-CM | POA: Diagnosis not present

## 2018-07-06 DIAGNOSIS — R1310 Dysphagia, unspecified: Secondary | ICD-10-CM | POA: Diagnosis not present

## 2018-07-07 DIAGNOSIS — E43 Unspecified severe protein-calorie malnutrition: Secondary | ICD-10-CM | POA: Diagnosis not present

## 2018-07-07 DIAGNOSIS — F411 Generalized anxiety disorder: Secondary | ICD-10-CM | POA: Diagnosis not present

## 2018-07-07 DIAGNOSIS — F333 Major depressive disorder, recurrent, severe with psychotic symptoms: Secondary | ICD-10-CM | POA: Diagnosis not present

## 2018-07-07 DIAGNOSIS — R1310 Dysphagia, unspecified: Secondary | ICD-10-CM | POA: Diagnosis not present

## 2018-07-07 DIAGNOSIS — J690 Pneumonitis due to inhalation of food and vomit: Secondary | ICD-10-CM | POA: Diagnosis not present

## 2018-07-07 DIAGNOSIS — G309 Alzheimer's disease, unspecified: Secondary | ICD-10-CM | POA: Diagnosis not present

## 2018-07-09 DIAGNOSIS — F333 Major depressive disorder, recurrent, severe with psychotic symptoms: Secondary | ICD-10-CM | POA: Diagnosis not present

## 2018-07-09 DIAGNOSIS — G309 Alzheimer's disease, unspecified: Secondary | ICD-10-CM | POA: Diagnosis not present

## 2018-07-09 DIAGNOSIS — J690 Pneumonitis due to inhalation of food and vomit: Secondary | ICD-10-CM | POA: Diagnosis not present

## 2018-07-09 DIAGNOSIS — E43 Unspecified severe protein-calorie malnutrition: Secondary | ICD-10-CM | POA: Diagnosis not present

## 2018-07-09 DIAGNOSIS — R1310 Dysphagia, unspecified: Secondary | ICD-10-CM | POA: Diagnosis not present

## 2018-07-09 DIAGNOSIS — F411 Generalized anxiety disorder: Secondary | ICD-10-CM | POA: Diagnosis not present

## 2018-07-10 DIAGNOSIS — F333 Major depressive disorder, recurrent, severe with psychotic symptoms: Secondary | ICD-10-CM | POA: Diagnosis not present

## 2018-07-10 DIAGNOSIS — G309 Alzheimer's disease, unspecified: Secondary | ICD-10-CM | POA: Diagnosis not present

## 2018-07-10 DIAGNOSIS — E43 Unspecified severe protein-calorie malnutrition: Secondary | ICD-10-CM | POA: Diagnosis not present

## 2018-07-10 DIAGNOSIS — J690 Pneumonitis due to inhalation of food and vomit: Secondary | ICD-10-CM | POA: Diagnosis not present

## 2018-07-10 DIAGNOSIS — R1310 Dysphagia, unspecified: Secondary | ICD-10-CM | POA: Diagnosis not present

## 2018-07-10 DIAGNOSIS — F411 Generalized anxiety disorder: Secondary | ICD-10-CM | POA: Diagnosis not present

## 2018-07-11 DIAGNOSIS — G309 Alzheimer's disease, unspecified: Secondary | ICD-10-CM | POA: Diagnosis not present

## 2018-07-11 DIAGNOSIS — J690 Pneumonitis due to inhalation of food and vomit: Secondary | ICD-10-CM | POA: Diagnosis not present

## 2018-07-11 DIAGNOSIS — F411 Generalized anxiety disorder: Secondary | ICD-10-CM | POA: Diagnosis not present

## 2018-07-11 DIAGNOSIS — E43 Unspecified severe protein-calorie malnutrition: Secondary | ICD-10-CM | POA: Diagnosis not present

## 2018-07-11 DIAGNOSIS — F333 Major depressive disorder, recurrent, severe with psychotic symptoms: Secondary | ICD-10-CM | POA: Diagnosis not present

## 2018-07-11 DIAGNOSIS — R1310 Dysphagia, unspecified: Secondary | ICD-10-CM | POA: Diagnosis not present

## 2018-08-11 DEATH — deceased

## 2019-05-20 IMAGING — CT CT RENAL STONE PROTOCOL
2 of 5 series · 16 of 46 positions shown, 18 images · non-contrast
Comparison: None.

CLINICAL DATA: Hematuria with unknown cause

EXAM:
CT ABDOMEN AND PELVIS WITHOUT CONTRAST
TECHNIQUE: Multidetector CT imaging of the abdomen and pelvis was performed
following the standard protocol without IV contrast.

[Series 3: thins · axial · 0.82mm/px · z∈[+1017,+1416]mm · 13 of 442 slices shown, 15 images]
[im 22/442  soft-tissue]
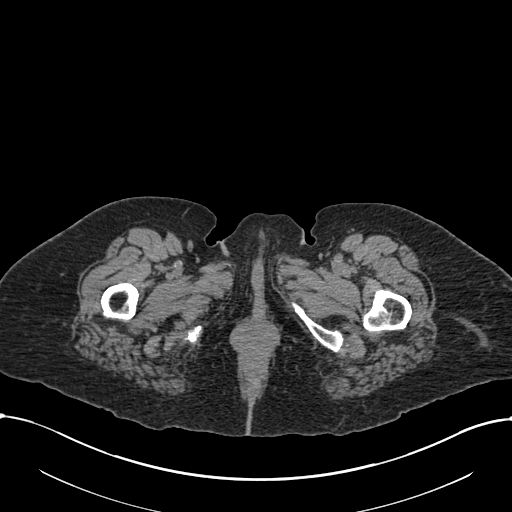
[im 22/442  bone]
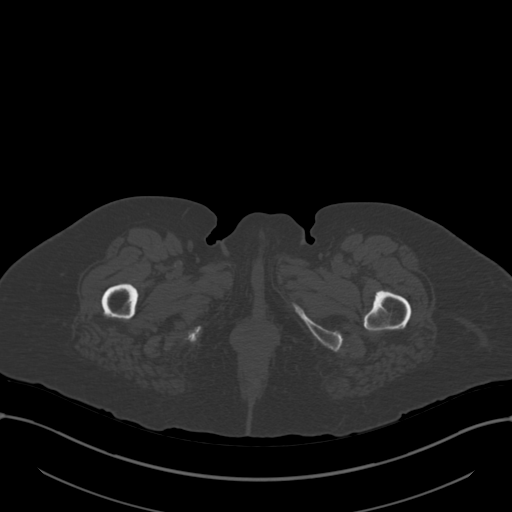
[im 64/442  soft-tissue]
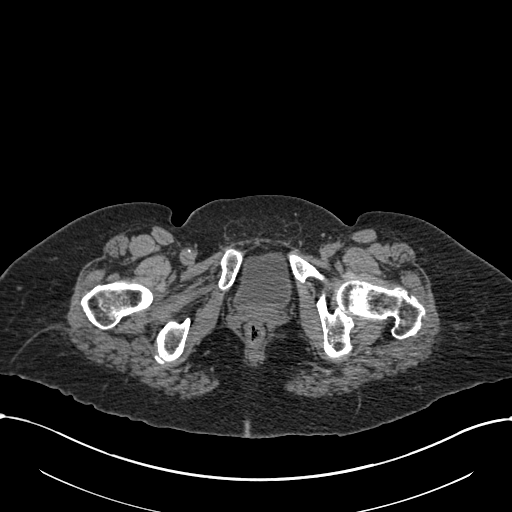
[im 85/442  soft-tissue]
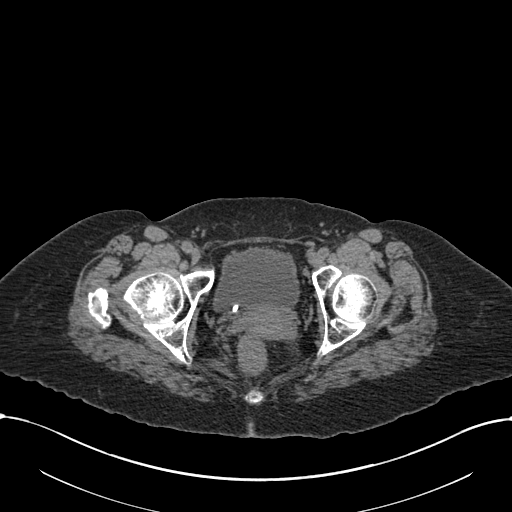
[im 127/442  soft-tissue]
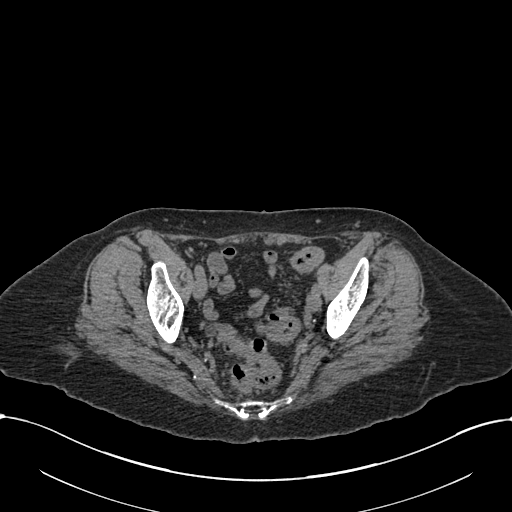
[im 148/442  soft-tissue]
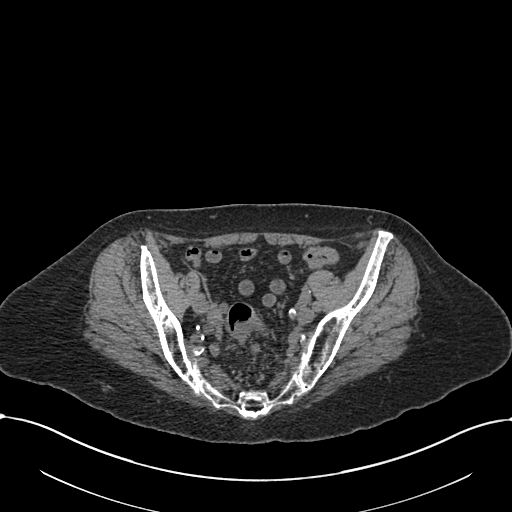
[im 190/442  soft-tissue]
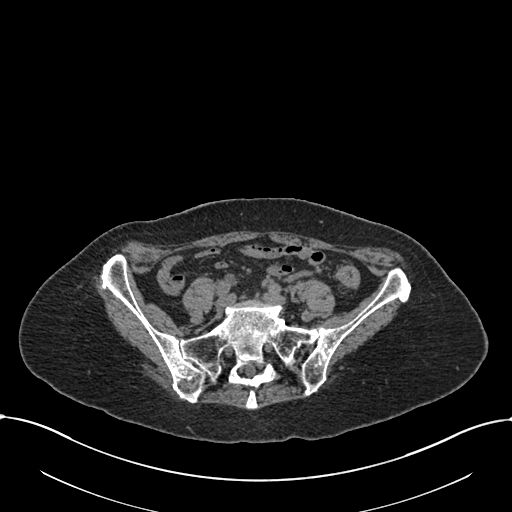
[im 232/442  soft-tissue]
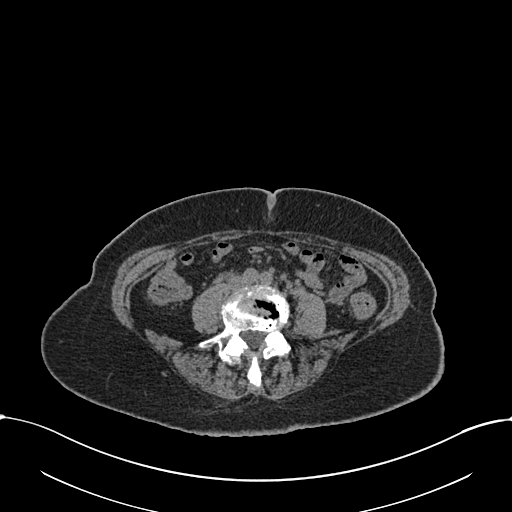
[im 253/442  soft-tissue]
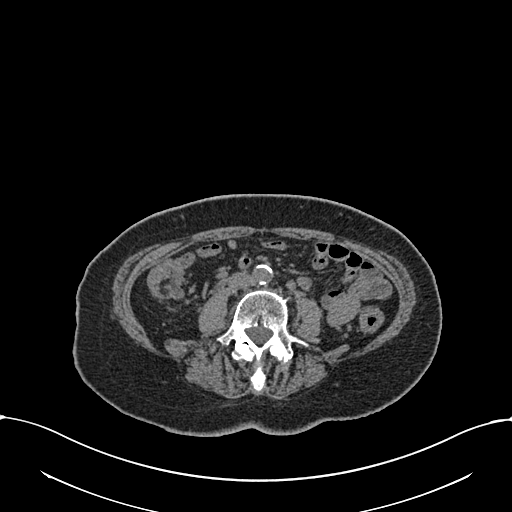
[im 295/442  soft-tissue]
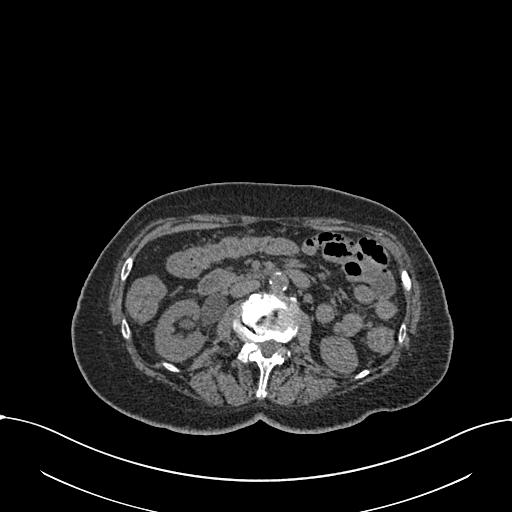
[im 295/442  bone]
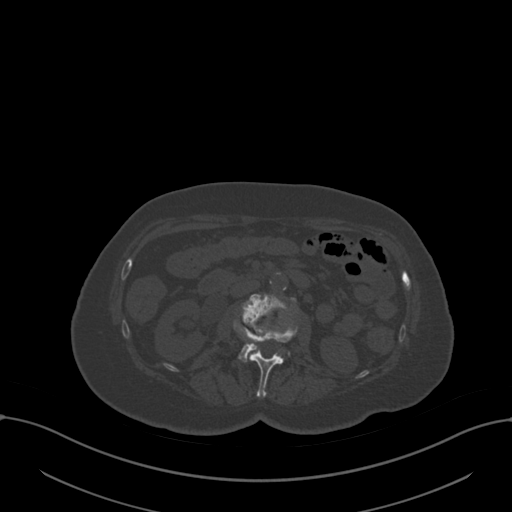
[im 316/442  soft-tissue]
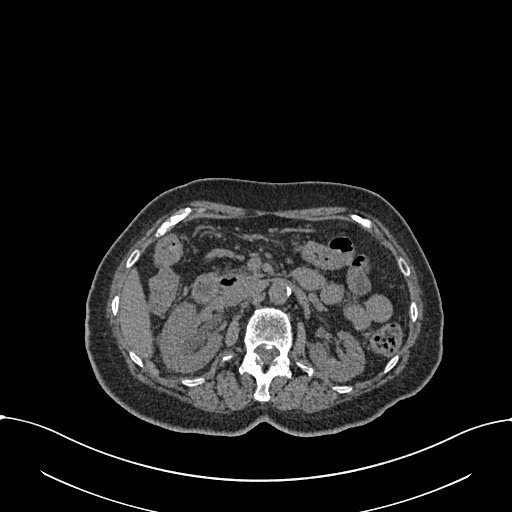
[im 358/442  soft-tissue]
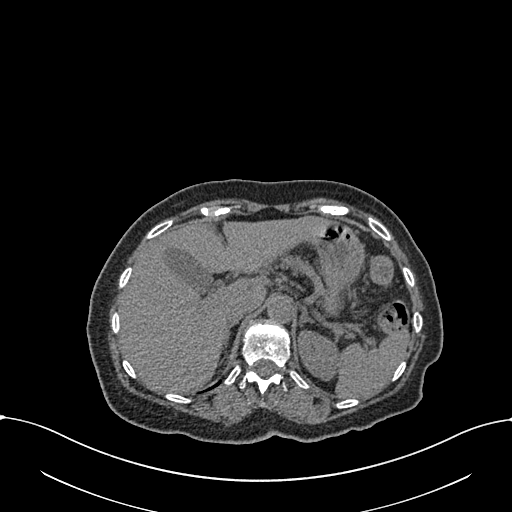
[im 379/442  soft-tissue]
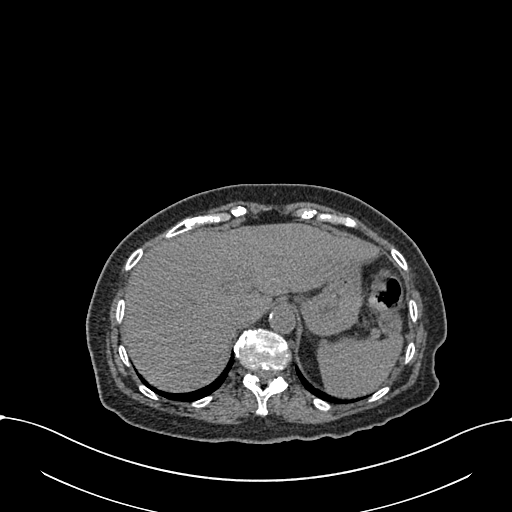
[im 421/442  soft-tissue]
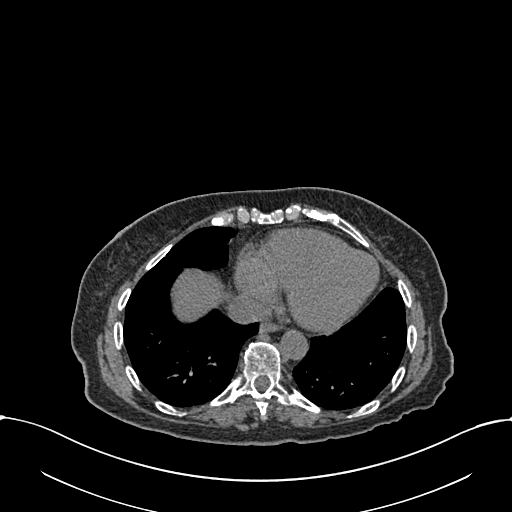

[Series 4: coronal · coronal · 0.64mm/px · 3 of 135 slices shown]
[im 45/135  soft-tissue]
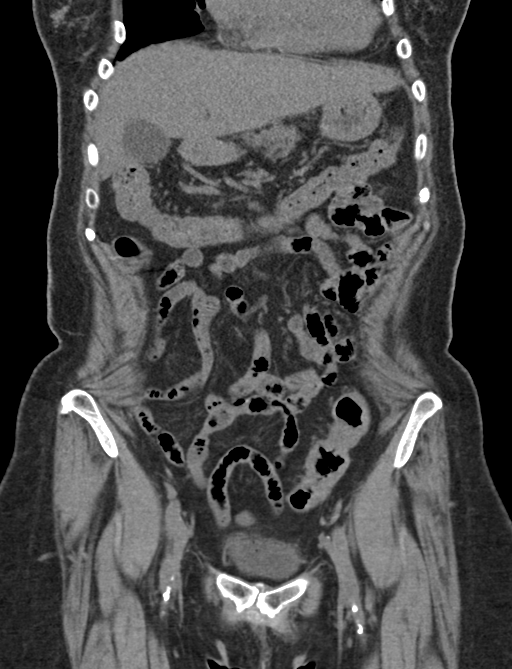
[im 60/135  soft-tissue]
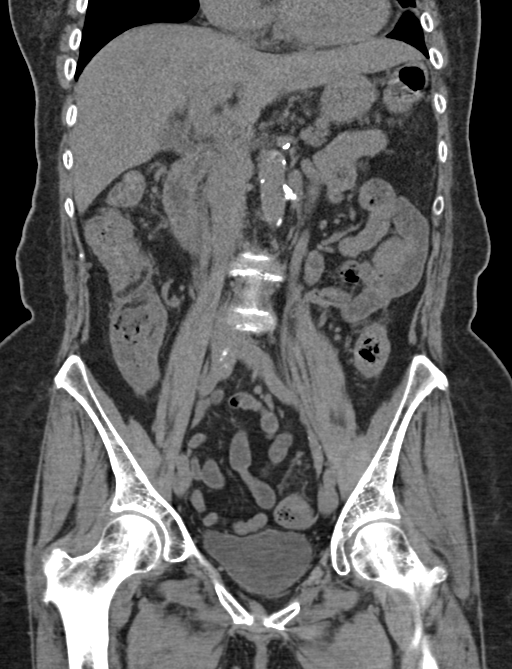
[im 75/135  soft-tissue]
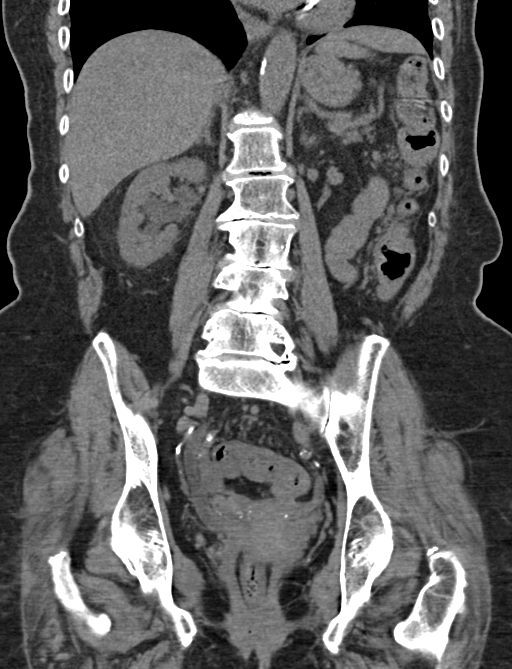

[16 of 46 positions shown; findings below may reference images not displayed]

FINDINGS: Lower chest:  Mitral annular calcification.  No acute finding.

Hepatobiliary: No focal liver abnormality.No evidence of biliary
obstruction or stone.

Pancreas: Generalized atrophy

Spleen: Unremarkable.

Adrenals/Urinary Tract: Negative adrenals. Moderate right
hydroureteronephrosis secondary to a 6 x 5 mm stone at the UVJ.
Small left renal cystic density. Gas in the urinary bladder, please
correlate with urinalysis and sampling method.

Stomach/Bowel: No obstruction. Prominent colonic Vasa recta but no
convincing wall thickening (when accounting for under distended
segments) and no history of diarrhea or inflammatory bowel disease.

Vascular/Lymphatic: Negative small bowel. Atherosclerotic
calcification

Reproductive:Unremarkable for age

Other: No ascites or pneumoperitoneum.

Musculoskeletal: Diffuse degenerative changes in the spine
IMPRESSION: 1. Obstructing 6 x 5 mm right UVJ calculus.
2. Gas in the urinary bladder, please correlate with urinalysis.
3.  Aortic Atherosclerosis ([id number]).
